# Patient Record
Sex: Female | Born: 1950
Health system: Southern US, Community
[De-identification: ages and names within clinical notes are randomized; demographics above are authoritative.]

## PROBLEM LIST (undated history)

## (undated) DIAGNOSIS — Z720 Tobacco use: Secondary | ICD-10-CM

## (undated) DIAGNOSIS — E0500 Thyrotoxicosis with diffuse goiter without thyrotoxic crisis or storm: Secondary | ICD-10-CM

## (undated) DIAGNOSIS — E039 Hypothyroidism, unspecified: Secondary | ICD-10-CM

## (undated) DIAGNOSIS — I10 Essential (primary) hypertension: Secondary | ICD-10-CM

## (undated) HISTORY — DX: Essential (primary) hypertension: I10

## (undated) HISTORY — PX: APPENDECTOMY: SHX54

## (undated) HISTORY — DX: Tobacco use: Z72.0

## (undated) HISTORY — DX: Hypothyroidism, unspecified: E03.9

## (undated) HISTORY — DX: Thyrotoxicosis with diffuse goiter without thyrotoxic crisis or storm: E05.00

## (undated) HISTORY — PX: OTHER SURGICAL HISTORY: SHX169

---

## 1998-02-11 ENCOUNTER — Encounter: Admission: RE | Admit: 1998-02-11 | Discharge: 1998-02-11 | Payer: Self-pay | Admitting: Family Medicine

## 1999-12-02 ENCOUNTER — Encounter: Admission: RE | Admit: 1999-12-02 | Discharge: 1999-12-02 | Payer: Self-pay | Admitting: *Deleted

## 1999-12-02 ENCOUNTER — Encounter: Payer: Self-pay | Admitting: *Deleted

## 2000-01-26 ENCOUNTER — Encounter: Payer: Self-pay | Admitting: Endocrinology

## 2000-01-26 ENCOUNTER — Ambulatory Visit (HOSPITAL_COMMUNITY): Admission: RE | Admit: 2000-01-26 | Discharge: 2000-01-26 | Payer: Self-pay | Admitting: Endocrinology

## 2000-02-02 ENCOUNTER — Encounter: Payer: Self-pay | Admitting: Endocrinology

## 2000-02-02 ENCOUNTER — Ambulatory Visit (HOSPITAL_COMMUNITY): Admission: RE | Admit: 2000-02-02 | Discharge: 2000-02-02 | Payer: Self-pay | Admitting: Endocrinology

## 2007-09-30 ENCOUNTER — Ambulatory Visit: Payer: Self-pay | Admitting: Internal Medicine

## 2007-09-30 DIAGNOSIS — I10 Essential (primary) hypertension: Secondary | ICD-10-CM

## 2007-09-30 DIAGNOSIS — J069 Acute upper respiratory infection, unspecified: Secondary | ICD-10-CM | POA: Insufficient documentation

## 2007-09-30 DIAGNOSIS — E890 Postprocedural hypothyroidism: Secondary | ICD-10-CM | POA: Insufficient documentation

## 2007-09-30 DIAGNOSIS — E039 Hypothyroidism, unspecified: Secondary | ICD-10-CM

## 2007-09-30 HISTORY — DX: Essential (primary) hypertension: I10

## 2007-09-30 HISTORY — DX: Hypothyroidism, unspecified: E03.9

## 2007-12-26 ENCOUNTER — Encounter: Payer: Self-pay | Admitting: Internal Medicine

## 2010-02-25 ENCOUNTER — Encounter (HOSPITAL_COMMUNITY): Payer: BC Managed Care – PPO

## 2010-02-25 ENCOUNTER — Other Ambulatory Visit: Payer: Self-pay | Admitting: Ophthalmology

## 2010-02-25 DIAGNOSIS — Z01812 Encounter for preprocedural laboratory examination: Secondary | ICD-10-CM | POA: Insufficient documentation

## 2010-02-25 LAB — DIFFERENTIAL
Basophils Relative: 1 % (ref 0–1)
Eosinophils Absolute: 0.3 10*3/uL (ref 0.0–0.7)
Eosinophils Relative: 4 % (ref 0–5)
Neutrophils Relative %: 72 % (ref 43–77)

## 2010-02-25 LAB — CBC
Platelets: 175 10*3/uL (ref 150–400)
RBC: 4.93 MIL/uL (ref 3.87–5.11)
RDW: 13.9 % (ref 11.5–15.5)
WBC: 8.4 10*3/uL (ref 4.0–10.5)

## 2010-02-26 ENCOUNTER — Other Ambulatory Visit: Payer: Self-pay | Admitting: Ophthalmology

## 2010-02-26 LAB — BASIC METABOLIC PANEL
Chloride: 106 mEq/L (ref 96–112)
Creatinine, Ser: 1.01 mg/dL (ref 0.4–1.2)
GFR calc Af Amer: >60 mL/min (ref >60)
GFR calc non Af Amer: 56 mL/min — ABNORMAL LOW (ref >60)
Potassium: 4 mEq/L (ref 3.5–5.1)

## 2010-03-03 ENCOUNTER — Ambulatory Visit (HOSPITAL_COMMUNITY)
Admission: RE | Admit: 2010-03-03 | Discharge: 2010-03-03 | Disposition: A | Discharge status: Home / Self Care | Payer: BC Managed Care – PPO | Source: Ambulatory Visit | Attending: Ophthalmology | Admitting: Ophthalmology

## 2010-03-03 DIAGNOSIS — H2510 Age-related nuclear cataract, unspecified eye: Secondary | ICD-10-CM | POA: Insufficient documentation

## 2010-03-03 DIAGNOSIS — Z79899 Other long term (current) drug therapy: Secondary | ICD-10-CM | POA: Insufficient documentation

## 2010-03-03 DIAGNOSIS — Z7982 Long term (current) use of aspirin: Secondary | ICD-10-CM | POA: Insufficient documentation

## 2010-03-03 DIAGNOSIS — I10 Essential (primary) hypertension: Secondary | ICD-10-CM | POA: Insufficient documentation

## 2010-03-03 DIAGNOSIS — Z01812 Encounter for preprocedural laboratory examination: Secondary | ICD-10-CM | POA: Insufficient documentation

## 2010-04-10 ENCOUNTER — Other Ambulatory Visit (HOSPITAL_COMMUNITY): Payer: BC Managed Care – PPO

## 2010-04-14 ENCOUNTER — Ambulatory Visit (HOSPITAL_COMMUNITY)
Admission: RE | Admit: 2010-04-14 | Discharge: 2010-04-14 | Disposition: A | Discharge status: Home / Self Care | Payer: BC Managed Care – PPO | Source: Ambulatory Visit | Attending: Ophthalmology | Admitting: Ophthalmology

## 2010-04-14 DIAGNOSIS — I10 Essential (primary) hypertension: Secondary | ICD-10-CM | POA: Insufficient documentation

## 2010-04-14 DIAGNOSIS — Z79899 Other long term (current) drug therapy: Secondary | ICD-10-CM | POA: Insufficient documentation

## 2010-04-14 DIAGNOSIS — H2510 Age-related nuclear cataract, unspecified eye: Secondary | ICD-10-CM | POA: Insufficient documentation

## 2010-07-28 NOTE — Op Note (Signed)
  [name], [name]             ACCOUNT NO.:  [id number]  MEDICAL RECORD NO.:  [id number]  LOCATION:  [facility]YP                          FACILITY:  [facility]  PHYSICIAN:  Susanne Greenhouse, MD       DATE OF BIRTH:  04/28/1950  DATE OF PROCEDURE: DATE OF DISCHARGE:  04/14/2010                              OPERATIVE REPORT   PREOPERATIVE DIAGNOSIS:  Nuclear cataract, right eye.  POSTOPERATIVE DIAGNOSIS:  Nuclear cataract, right eye.  DIAGNOSIS CODE:  366.16.  PROSTHETIC DEVICE USED:  Lenstec posterior chamber lens, model Softec HD.  Power of 11.5, serial number is [id number].          ______________________________ Susanne Greenhouse, MD     KEH/MEDQ  D:  06/16/2010  T:  06/17/2010  Job:  [id number]  Electronically Signed by Gemma Payor MD on 07/28/2010 10:24:57 AM

## 2011-03-10 ENCOUNTER — Encounter: Payer: Self-pay | Admitting: Internal Medicine

## 2011-03-12 ENCOUNTER — Ambulatory Visit: Payer: BC Managed Care – PPO | Admitting: Internal Medicine

## 2012-08-12 ENCOUNTER — Other Ambulatory Visit: Payer: Self-pay | Admitting: *Deleted

## 2012-08-12 MED ORDER — LEVOTHYROXINE SODIUM 112 MCG PO TABS: 112.0000 ug | ORAL_TABLET | Freq: Every day | ORAL | Status: DC

## 2012-08-12 MED ORDER — BISOPROLOL-HYDROCHLOROTHIAZIDE 5-6.25 MG PO TABS: 1.0000 | ORAL_TABLET | Freq: Every day | ORAL | Status: DC

## 2012-09-26 ENCOUNTER — Ambulatory Visit: Payer: BC Managed Care – PPO | Admitting: Endocrinology

## 2012-09-28 ENCOUNTER — Encounter: Payer: Self-pay | Admitting: Endocrinology

## 2012-09-28 ENCOUNTER — Ambulatory Visit (INDEPENDENT_AMBULATORY_CARE_PROVIDER_SITE_OTHER): Payer: BC Managed Care – PPO | Admitting: Endocrinology

## 2012-09-28 ENCOUNTER — Other Ambulatory Visit: Payer: Self-pay | Admitting: *Deleted

## 2012-09-28 VITALS — BP 122/78 | HR 62 | Temp 98.3°F | Resp 12 | Ht 62.0 in | Wt 207.6 lb

## 2012-09-28 DIAGNOSIS — I10 Essential (primary) hypertension: Secondary | ICD-10-CM

## 2012-09-28 DIAGNOSIS — Z23 Encounter for immunization: Secondary | ICD-10-CM

## 2012-09-28 DIAGNOSIS — E890 Postprocedural hypothyroidism: Secondary | ICD-10-CM

## 2012-09-28 LAB — URINALYSIS, ROUTINE W REFLEX MICROSCOPIC
Bilirubin Urine: NEGATIVE
Leukocytes, UA: NEGATIVE
Nitrite: NEGATIVE
Specific Gravity, Urine: 1.02 (ref 1.000–1.030)
Total Protein, Urine: NEGATIVE
Urine Glucose: NEGATIVE
pH: 6 (ref 5.0–8.0)

## 2012-09-28 LAB — COMPREHENSIVE METABOLIC PANEL
Albumin: 3.8 g/dL (ref 3.5–5.2)
CO2: 31 mEq/L (ref 19–32)
Calcium: 9.7 mg/dL (ref 8.4–10.5)
Chloride: 104 mEq/L (ref 96–112)
GFR: 58.58 mL/min — ABNORMAL LOW (ref >60.00)
Glucose, Bld: 75 mg/dL (ref 70–99)
Potassium: 3.6 mEq/L (ref 3.5–5.1)
Sodium: 141 mEq/L (ref 135–145)
Total Protein: 7.6 g/dL (ref 6.0–8.3)

## 2012-09-28 LAB — LIPID PANEL: Total CHOL/HDL Ratio: 5

## 2012-09-28 LAB — TSH: TSH: 4.38 u[IU]/mL (ref 0.35–5.50)

## 2012-09-28 NOTE — Progress Notes (Signed)
Quick Note:  Please let patient know that the thyroid result is low normal; to take EXTRA HALF TAB once week of Synthroid. Cholesterol borderline high, reduce animal and daily fats ______

## 2012-09-28 NOTE — Progress Notes (Signed)
Patient ID: Olivia Blanchard, female   DOB: [birth year], 62 y.o.   MRN: [id number]  Reason for Appointment:  Hypothyroidism, followup visit    History of Present Illness:    The hypothyroidism was first diagnosed in 2002 after treatment of Graves' disease with I-131  The symptoms consistent with hypothyroidism are: none, has no fatigue or cord intolerance. Does have some weight gain in the last year  The treatments that the patient has taken include generic Synthroid.           The response to therapy has been  well-controlled symptoms. However she had gained weight progressively after her I-131 ablation         Compliance with the medical regimen has been as prescribed with taking the tablet in the morning before breakfast.  HYPERTENSION: She has been on treatment since about 2005. More recently has been taking Ziac with good control. No lightheadedness or does not monitor blood pressure at home and has not seen a PCP regularly    Medication List       This list is accurate as of: 09/28/12 10:17 AM.  Always use your most recent med list.               bisoprolol-hydrochlorothiazide 5-6.25 MG per tablet  Commonly known as:  ZIAC  Take 1 tablet by mouth daily. Wants 90 day supply     levothyroxine 112 MCG tablet  Commonly known as:  SYNTHROID, LEVOTHROID  Take 112 mcg by mouth daily. Wants 90 day supply     PRESERVISION AREDS 2 PO  Take by mouth.        Past Medical History  Diagnosis Date  . HYPERTENSION 09/30/2007  . HYPOTHYROIDISM 09/30/2007  . Grave's disease   . Tobacco abuse     Past Surgical History  Procedure Laterality Date  . Appendectomy      Family History  Problem Relation Age of Onset  . Diabetes Mother   . Thyroid disease Mother   . Cancer Neg Hx     sister s- lung Ca, copd    Social History:  reports that she has been smoking.  She has never used smokeless tobacco. Her alcohol and drug histories are not on file.  Allergies:  Allergies   Allergen Reactions  . Codeine Phosphate    ROS  Insomnia is her only complaint Has had mild hypercholesterolemia previously No history of diabetes   Examination:   BP 110/68  Pulse 62  Temp(Src) 98.3 F (36.8 C)  Resp 12  Ht 5\' 2"  (1.575 m)  Wt 207 lb 9.6 oz (94.167 kg)  BMI 37.96 kg/m2  SpO2 98%   GENERAL APPEARANCE: Alert And looks well.    NECK: no mass in the thyroid bed           NEUROLOGIC EXAM: DTRs 2+ bilaterally at biceps. No tremor    Assessments/PLAN   Hypothyroidism, post ablative and currently asymptomatic on 112 mcg Synthroid supplementation, she prefers generic Needs to have thyroid levels checked today to adjust medication if needed especially since she has gained weight  Hypertension: This has been mild and blood pressure is well-controlled with diet, asymptomatic  Previous history of hyperlipidemia: Will check lipids again   To review previous records when available   [facility] 09/28/2012, 10:17 AM   Addendum: TSH 4.3, will have her take extra half tablet on Sundays LDL about 130, continue diet alone

## 2012-09-29 ENCOUNTER — Telehealth: Payer: Self-pay | Admitting: *Deleted

## 2012-09-29 NOTE — Telephone Encounter (Signed)
N/a on home phone 

## 2012-09-29 NOTE — Telephone Encounter (Signed)
Message copied by Hermenia Bers on Thu Sep 29, 2012 11:49 AM ------      Message from: Reather Littler      Created: Wed Sep 28, 2012  7:17 PM       Please let patient know that the thyroid result is low normal; to take EXTRA HALF TAB once week of Synthroid. Cholesterol borderline high, reduce animal and daily fats ------

## 2012-09-30 ENCOUNTER — Telehealth: Payer: Self-pay | Admitting: Endocrinology

## 2012-09-30 MED ORDER — BISOPROLOL-HYDROCHLOROTHIAZIDE 5-6.25 MG PO TABS: 1.0000 | ORAL_TABLET | Freq: Every day | ORAL | Status: DC

## 2012-09-30 MED ORDER — LEVOTHYROXINE SODIUM 112 MCG PO TABS: 112.0000 ug | ORAL_TABLET | Freq: Every day | ORAL | Status: DC

## 2012-09-30 NOTE — Telephone Encounter (Signed)
Results given, rx sent

## 2012-12-20 ENCOUNTER — Telehealth: Payer: Self-pay | Admitting: *Deleted

## 2012-12-20 NOTE — Telephone Encounter (Signed)
I am not her PCP, she is to call whoever her PCP is

## 2012-12-20 NOTE — Telephone Encounter (Signed)
Noted, pt is aware 

## 2012-12-20 NOTE — Telephone Encounter (Signed)
Pt is wanting an rx for a cough, she said it started yesterday and she hardly slept, she's not coughing anything up nor is she running a fever.

## 2012-12-28 ENCOUNTER — Encounter: Payer: Self-pay | Admitting: *Deleted

## 2012-12-28 ENCOUNTER — Ambulatory Visit: Payer: BC Managed Care – PPO | Admitting: Endocrinology

## 2013-03-21 ENCOUNTER — Other Ambulatory Visit: Payer: Self-pay | Admitting: Endocrinology

## 2013-03-24 ENCOUNTER — Other Ambulatory Visit: Payer: Self-pay | Admitting: Endocrinology

## 2013-06-17 ENCOUNTER — Other Ambulatory Visit: Payer: Self-pay | Admitting: Endocrinology

## 2013-09-25 ENCOUNTER — Other Ambulatory Visit (INDEPENDENT_AMBULATORY_CARE_PROVIDER_SITE_OTHER): Payer: BC Managed Care – PPO

## 2013-09-25 ENCOUNTER — Other Ambulatory Visit: Payer: Self-pay | Admitting: *Deleted

## 2013-09-25 DIAGNOSIS — E890 Postprocedural hypothyroidism: Secondary | ICD-10-CM

## 2013-09-26 LAB — T4, FREE: Free T4: 1.03 ng/dL (ref 0.60–1.60)

## 2013-09-26 LAB — TSH: TSH: 2.87 u[IU]/mL (ref 0.35–4.50)

## 2013-09-28 ENCOUNTER — Ambulatory Visit (INDEPENDENT_AMBULATORY_CARE_PROVIDER_SITE_OTHER): Payer: BC Managed Care – PPO | Admitting: Endocrinology

## 2013-09-28 ENCOUNTER — Encounter: Payer: Self-pay | Admitting: Endocrinology

## 2013-09-28 VITALS — BP 125/78 | HR 62 | Temp 97.8°F | Wt 203.0 lb

## 2013-09-28 DIAGNOSIS — Z23 Encounter for immunization: Secondary | ICD-10-CM

## 2013-09-28 DIAGNOSIS — I10 Essential (primary) hypertension: Secondary | ICD-10-CM

## 2013-09-28 DIAGNOSIS — E890 Postprocedural hypothyroidism: Secondary | ICD-10-CM

## 2013-09-28 MED ORDER — BISOPROLOL-HYDROCHLOROTHIAZIDE 5-6.25 MG PO TABS: ORAL_TABLET | ORAL | Status: DC

## 2013-09-28 MED ORDER — LEVOTHYROXINE SODIUM 112 MCG PO TABS: ORAL_TABLET | ORAL | Status: DC

## 2013-09-28 NOTE — Progress Notes (Signed)
Patient ID: Olivia Blanchard, female   DOB: [birth year], 63 y.o.   MRN: [id number]  Reason for Appointment:  Hypothyroidism, followup visit    History of Present Illness:    The hypothyroidism was first diagnosed in 2002 after treatment of Graves' disease with I-131  The symptoms consistent with hypothyroidism are: none, has no fatigue or cord intolerance. Does have some weight gain in the last year  The treatments that the patient has taken include generic Synthroid.           The response to therapy has been  well-controlled symptoms. However she had gained weight after her I-131 ablation         Compliance with the medical regimen has been as prescribed with taking the tablet daily in the morning before breakfast. Her dose has not been changed significantly. She was told to take an extra half tablet on Sundays on her last visit but she forgot to do this; however her TSH is still improved  She does complain of feeling fatigued but this is not new and related to her difficulty sleeping which is chronic  Lab Results  Component Value Date   FREET4 1.03 09/25/2013   FREET4 0.91 09/28/2012   TSH 2.87 09/25/2013   TSH 4.38 09/28/2012   Wt Readings from Last 3 Encounters:  09/28/13 203 lb (92.08 kg)  09/28/12 207 lb 9.6 oz (94.167 kg)  09/30/07 213 lb (96.616 kg)    HYPERTENSION: She has been on treatment since about 2005. More recently has been taking Ziac with good control. No lightheadedness or does not monitor blood pressure at home and has not seen a PCP regularly    Medication List       This list is accurate as of: 09/28/13  9:59 AM.  Always use your most recent med list.               bisoprolol-hydrochlorothiazide 5-6.25 MG per tablet  Commonly known as:  ZIAC  TAKE 1 TABLET BY MOUTH EVERY DAY     levothyroxine 112 MCG tablet  Commonly known as:  SYNTHROID, LEVOTHROID  TAKE 1 TABLET BY MOUTH EVERY DAY **SCHEDULE FOLLOW UP APPT WITH DR Olivia Blanchard FOR FURTHER REFILLS**     PRESERVISION AREDS 2 PO  Take by mouth.        Past Medical History  Diagnosis Date  . HYPERTENSION 09/30/2007  . HYPOTHYROIDISM 09/30/2007  . Grave's disease   . Tobacco abuse     Past Surgical History  Procedure Laterality Date  . Appendectomy      Family History  Problem Relation Age of Onset  . Diabetes Mother   . Thyroid disease Mother   . Cancer Neg Hx     sister s- lung Ca, copd    Social History:  reports that she has been smoking.  She has never used smokeless tobacco. Her alcohol and drug histories are not on file.  Allergies:  Allergies  Allergen Reactions  . Codeine Phosphate    ROS  Continues to have chronic insomnia Not depressed Has had mild hypercholesterolemia previously, last LDL 130  No history of diabetes   Examination:   BP 125/78  Pulse 62  Temp(Src) 97.8 F (36.6 C)  Wt 203 lb (92.08 kg)  SpO2 99%   GENERAL APPEARANCE:  she looks well, no puffiness of the hands or eyes  NECK:  thyroid nonpalpable        NEUROLOGIC EXAM: DTRs 2+ bilaterally at biceps No peripheral edema  Assessments/PLAN   Hypothyroidism, post ablative and currently doing well on 112 mcg Synthroid supplementation, she prefers generic Her thyroid levels are excellent and she will continue the same dose She will followup in one year  Hypertension: This has been mild and blood pressure is well-controlled with Ziac 5 mg  Encouraged her to establish with a PCP and she will try to do this locally  [facility] 09/28/2013, 9:59 AM

## 2013-09-28 NOTE — Progress Notes (Signed)
Pre visit review using our clinic review tool, if applicable. No additional management support is needed unless otherwise documented below in the visit note. 

## 2013-09-28 NOTE — Patient Instructions (Signed)
No change 

## 2014-07-20 ENCOUNTER — Other Ambulatory Visit: Payer: Self-pay | Admitting: *Deleted

## 2014-07-20 MED ORDER — LEVOTHYROXINE SODIUM 112 MCG PO TABS: ORAL_TABLET | ORAL | Status: DC

## 2014-09-11 ENCOUNTER — Other Ambulatory Visit: Payer: Self-pay | Admitting: *Deleted

## 2014-09-11 MED ORDER — BISOPROLOL-HYDROCHLOROTHIAZIDE 5-6.25 MG PO TABS: ORAL_TABLET | ORAL | Status: DC

## 2014-10-16 ENCOUNTER — Other Ambulatory Visit: Payer: Self-pay | Admitting: *Deleted

## 2014-10-16 ENCOUNTER — Other Ambulatory Visit (INDEPENDENT_AMBULATORY_CARE_PROVIDER_SITE_OTHER): Payer: Self-pay

## 2014-10-16 DIAGNOSIS — E890 Postprocedural hypothyroidism: Secondary | ICD-10-CM

## 2014-10-16 LAB — BASIC METABOLIC PANEL
BUN: 14 mg/dL (ref 6–23)
CO2: 24 mEq/L (ref 19–32)
Calcium: 9.5 mg/dL (ref 8.4–10.5)
Chloride: 104 mEq/L (ref 96–112)
Creatinine, Ser: 1.04 mg/dL (ref 0.40–1.20)
GFR: 56.89 mL/min — AB (ref >60.00)
Glucose, Bld: 87 mg/dL (ref 70–99)
POTASSIUM: 4.3 meq/L (ref 3.5–5.1)
SODIUM: 141 meq/L (ref 135–145)

## 2014-10-16 LAB — TSH: TSH: 13.69 u[IU]/mL — AB (ref 0.35–4.50)

## 2014-10-16 LAB — T4, FREE: Free T4: 0.76 ng/dL (ref 0.60–1.60)

## 2014-10-25 ENCOUNTER — Other Ambulatory Visit: Payer: Self-pay | Admitting: *Deleted

## 2014-10-25 ENCOUNTER — Encounter: Payer: Self-pay | Admitting: Endocrinology

## 2014-10-25 ENCOUNTER — Ambulatory Visit (INDEPENDENT_AMBULATORY_CARE_PROVIDER_SITE_OTHER): Payer: Self-pay | Admitting: Endocrinology

## 2014-10-25 ENCOUNTER — Telehealth: Payer: Self-pay | Admitting: Endocrinology

## 2014-10-25 VITALS — BP 138/82 | HR 64 | Temp 98.5°F | Resp 16 | Ht 62.0 in | Wt 200.4 lb

## 2014-10-25 DIAGNOSIS — E890 Postprocedural hypothyroidism: Secondary | ICD-10-CM

## 2014-10-25 DIAGNOSIS — I10 Essential (primary) hypertension: Secondary | ICD-10-CM

## 2014-10-25 MED ORDER — BISOPROLOL-HYDROCHLOROTHIAZIDE 5-6.25 MG PO TABS: ORAL_TABLET | ORAL | Status: DC

## 2014-10-25 MED ORDER — LEVOTHYROXINE SODIUM 137 MCG PO TABS: 137.0000 ug | ORAL_TABLET | Freq: Every day | ORAL | Status: DC

## 2014-10-25 NOTE — Patient Instructions (Signed)
Pink pill 8 per week  New rx 1 daily

## 2014-10-25 NOTE — Progress Notes (Signed)
Patient ID: DAINA CARA, female   DOB: 09-Nov-1950, 64 y.o.   MRN: [id number]   Reason for Appointment:  Hypothyroidism, followup visit    History of Present Illness:    The hypothyroidism was first diagnosed in 2002 after treatment of Graves' disease with I-131  The symptoms consistent with hypothyroidism are: none, has no fatigue or cold intolerance. Does have some weight gain in the last year  The treatments that the patient has taken include generic Synthroid.           The response to therapy has been  well-controlled symptoms. However she had gained weight after her I-131 ablation       Recent history:       She tends to get tired but this is not new and is not any worse No cold intolerance. Compliance with the medical regimen has been as prescribed with taking the tablet daily in the morning but sometimes takes it after eating , usually not having any day product in the morning   Her dose has not been changed significantly.   However her TSH is higher than usual and not clear why, has not gained any weight.  Wt Readings from Last 3 Encounters:  10/25/14 200 lb 6.4 oz (90.901 kg)  09/28/13 203 lb (92.08 kg)  09/28/12 207 lb 9.6 oz (94.167 kg)   Lab Results  Component Value Date   TSH 13.69* 10/16/2014   TSH 2.87 09/25/2013   TSH 4.38 09/28/2012   FREET4 0.76 10/16/2014   FREET4 1.03 09/25/2013   FREET4 0.91 09/28/2012     HYPERTENSION: She has been on treatment since about 2005.   She has been taking Ziac 5 mg with good control.  No lightheadedness or does not monitor blood pressure at home  Does not have a primary care physician     Medication List       This list is accurate as of: 10/25/14  3:19 PM.  Always use your most recent med list.               bisoprolol-hydrochlorothiazide 5-6.25 MG tablet  Commonly known as:  ZIAC  TAKE 1 TABLET BY MOUTH EVERY DAY     levothyroxine 137 MCG tablet  Commonly known as:  SYNTHROID  Take 1  tablet (137 mcg total) by mouth daily before breakfast.        Past Medical History  Diagnosis Date  . HYPERTENSION 09/30/2007  . HYPOTHYROIDISM 09/30/2007  . Grave's disease   . Tobacco abuse     Past Surgical History  Procedure Laterality Date  . Appendectomy      Family History  Problem Relation Age of Onset  . Diabetes Mother   . Thyroid disease Mother   . Cancer Neg Hx     sister s- lung Ca, copd    Social History:  reports that she has been smoking.  She has never used smokeless tobacco. Her alcohol and drug histories are not on file.  Allergies:  Allergies  Allergen Reactions  . Codeine Phosphate    ROS  Continues to have chronic insomnia  Has had mild hypercholesterolemia previously, last LDL 130  No history of diabetes  And glucose is normal   Examination:   BP 138/82 mmHg  Pulse 64  Temp(Src) 98.5 F (36.9 C)  Resp 16  Ht 5\' 2"  (1.575 m)  Wt 200 lb 6.4 oz (90.901 kg)  BMI 36.64 kg/m2  SpO2 97%   GENERAL APPEARANCE:  she looks well  She has prominence of the left eye with mild lipid retraction and exophthalmos with measurement of 24 mm on the left side, about 22 on the  right  NECK:  thyroid nonpalpable        NEUROLOGIC EXAM:  reflexes brisk bilaterally at biceps No peripheral edema    Assessments/PLAN   Hypothyroidism, post ablative and currently Taking 112 g levothyroxine.  Not clear why her TSH is significantly higher than usual even with good compliance.  she does not appear to be symptomatic    We will increase her dose to 137 g and have her TSH checked in 2 months again  She prefers generic because of the cost She will followup in one year  Hypertension: This has been mild and blood pressure is well-controlled with Ziac 5 mg , to continue   [facility] 10/25/2014, 3:19 PM

## 2014-10-26 NOTE — Telephone Encounter (Signed)
error 

## 2014-12-25 ENCOUNTER — Other Ambulatory Visit (INDEPENDENT_AMBULATORY_CARE_PROVIDER_SITE_OTHER): Payer: Self-pay

## 2014-12-25 DIAGNOSIS — E890 Postprocedural hypothyroidism: Secondary | ICD-10-CM

## 2014-12-25 LAB — TSH: TSH: 0.65 u[IU]/mL (ref 0.35–4.50)

## 2014-12-26 NOTE — Progress Notes (Signed)
Quick Note:  Please let patient know that the lab result is normal and no change needed ______ 

## 2015-01-15 ENCOUNTER — Encounter: Payer: Self-pay | Admitting: *Deleted

## 2015-01-15 ENCOUNTER — Telehealth: Payer: Self-pay | Admitting: Endocrinology

## 2015-01-15 NOTE — Telephone Encounter (Signed)
error 

## 2015-01-16 ENCOUNTER — Other Ambulatory Visit: Payer: Self-pay | Admitting: *Deleted

## 2015-01-16 MED ORDER — LEVOTHYROXINE SODIUM 137 MCG PO TABS: 137.0000 ug | ORAL_TABLET | Freq: Every day | ORAL | Status: DC

## 2015-05-08 ENCOUNTER — Telehealth: Payer: Self-pay | Admitting: Endocrinology

## 2015-05-08 ENCOUNTER — Other Ambulatory Visit: Payer: Self-pay | Admitting: *Deleted

## 2015-05-08 MED ORDER — BISOPROLOL-HYDROCHLOROTHIAZIDE 5-6.25 MG PO TABS: ORAL_TABLET | ORAL | Status: DC

## 2015-05-08 NOTE — Telephone Encounter (Signed)
[facility] needs refill for ziac

## 2015-05-08 NOTE — Telephone Encounter (Signed)
rx sent

## 2015-08-05 ENCOUNTER — Other Ambulatory Visit: Payer: Self-pay

## 2015-08-05 ENCOUNTER — Telehealth: Payer: Self-pay | Admitting: Endocrinology

## 2015-08-05 MED ORDER — LEVOTHYROXINE SODIUM 137 MCG PO TABS
137.0000 ug | ORAL_TABLET | Freq: Every day | ORAL | 5 refills | Status: DC
Start: 2015-08-05 — End: 2015-10-25

## 2015-08-05 NOTE — Telephone Encounter (Signed)
Rx submitted for Levothyroxine to the [facility].

## 2015-08-05 NOTE — Telephone Encounter (Signed)
Refills of medication levothyroxine (SYNTHROID) 137 MCG tablet  [address] [place], [place] - [address] [phone] (Phone) [phone] (Fax)

## 2015-10-21 ENCOUNTER — Other Ambulatory Visit: Payer: Self-pay | Admitting: Endocrinology

## 2015-10-21 DIAGNOSIS — I10 Essential (primary) hypertension: Secondary | ICD-10-CM

## 2015-10-21 DIAGNOSIS — E890 Postprocedural hypothyroidism: Secondary | ICD-10-CM

## 2015-10-22 ENCOUNTER — Other Ambulatory Visit (INDEPENDENT_AMBULATORY_CARE_PROVIDER_SITE_OTHER): Payer: Self-pay

## 2015-10-22 DIAGNOSIS — E890 Postprocedural hypothyroidism: Secondary | ICD-10-CM

## 2015-10-22 DIAGNOSIS — I10 Essential (primary) hypertension: Secondary | ICD-10-CM

## 2015-10-22 LAB — COMPREHENSIVE METABOLIC PANEL
ALK PHOS: 80 U/L (ref 39–117)
ALT: 15 U/L (ref 0–35)
AST: 19 U/L (ref 0–37)
Albumin: 4 g/dL (ref 3.5–5.2)
BILIRUBIN TOTAL: 0.3 mg/dL (ref 0.2–1.2)
BUN: 14 mg/dL (ref 6–23)
CALCIUM: 9.5 mg/dL (ref 8.4–10.5)
CO2: 30 mEq/L (ref 19–32)
Chloride: 103 mEq/L (ref 96–112)
Creatinine, Ser: 1.03 mg/dL (ref 0.40–1.20)
GFR: 57.35 mL/min — AB (ref >60.00)
GLUCOSE: 85 mg/dL (ref 70–99)
POTASSIUM: 3.7 meq/L (ref 3.5–5.1)
Sodium: 140 mEq/L (ref 135–145)
TOTAL PROTEIN: 7.4 g/dL (ref 6.0–8.3)

## 2015-10-22 LAB — LIPID PANEL
Cholesterol: 164 mg/dL (ref 0–200)
HDL: 41.3 mg/dL (ref >39.00)
LDL Cholesterol: 104 mg/dL — ABNORMAL HIGH (ref 0–99)
NONHDL: 122.38
TRIGLYCERIDES: 91 mg/dL (ref 0.0–149.0)
Total CHOL/HDL Ratio: 4
VLDL: 18.2 mg/dL (ref 0.0–40.0)

## 2015-10-22 LAB — TSH: TSH: 0.58 u[IU]/mL (ref 0.35–4.50)

## 2015-10-22 LAB — T4, FREE: FREE T4: 1.1 ng/dL (ref 0.60–1.60)

## 2015-10-25 ENCOUNTER — Other Ambulatory Visit: Payer: Self-pay | Admitting: *Deleted

## 2015-10-25 ENCOUNTER — Ambulatory Visit (INDEPENDENT_AMBULATORY_CARE_PROVIDER_SITE_OTHER): Payer: Self-pay | Admitting: Endocrinology

## 2015-10-25 ENCOUNTER — Encounter: Payer: Self-pay | Admitting: Endocrinology

## 2015-10-25 VITALS — BP 118/68 | HR 68 | Temp 98.0°F | Resp 16 | Ht 62.0 in | Wt 195.6 lb

## 2015-10-25 DIAGNOSIS — I10 Essential (primary) hypertension: Secondary | ICD-10-CM

## 2015-10-25 DIAGNOSIS — E890 Postprocedural hypothyroidism: Secondary | ICD-10-CM

## 2015-10-25 DIAGNOSIS — E7800 Pure hypercholesterolemia, unspecified: Secondary | ICD-10-CM

## 2015-10-25 MED ORDER — LEVOTHYROXINE SODIUM 137 MCG PO TABS: 137.0000 ug | ORAL_TABLET | Freq: Every day | ORAL | 11 refills | Status: DC

## 2015-10-25 MED ORDER — BISOPROLOL-HYDROCHLOROTHIAZIDE 5-6.25 MG PO TABS: ORAL_TABLET | ORAL | 11 refills | Status: DC

## 2015-10-25 NOTE — Patient Instructions (Signed)
Skip Sundays for Thyroid

## 2015-10-25 NOTE — Progress Notes (Signed)
Patient ID: Olivia Blanchard, female   DOB: January 02, 1951, 65 y.o.   MRN: [id number]   Reason for Appointment:  Hypothyroidism, followup visit    History of Present Illness:    The hypothyroidism was first diagnosed in 2002 after treatment of Graves' disease with I-131  The symptoms consistent with hypothyroidism are: none, has no fatigue or cold intolerance. Does have some weight gain in the last year  The treatments that the patient has taken include generic Synthroid.           The response to therapy has been  well-controlled symptoms. However she had gained weight after her I-131 ablation      Recent history:       On her last visit in 10/2014 her TSH was 13.7.  She was having her usual mild fatigue but she thinks that after increasing her dose from 112 up to 137 she had less fatigue She feels fairly good now and also no shakiness or palpitations She has been trying to lose weight and is doing better with this  Compliance with the medical regimen has been as prescribed with taking the tablet daily in the morning   Wt Readings from Last 3 Encounters:  10/25/15 195 lb 9.6 oz (88.7 kg)  10/25/14 200 lb 6.4 oz (90.9 kg)  09/28/13 203 lb (92.1 kg)   Her follow-up TSH in 12/16 was only 0.65 and she is taking 6-1/2 tablets a week of the 137 g dose   Lab Results  Component Value Date   TSH 0.58 10/22/2015   TSH 0.65 12/25/2014   TSH 13.69 (H) 10/16/2014   FREET4 1.10 10/22/2015   FREET4 0.76 10/16/2014   FREET4 1.03 09/25/2013    Graves eye disease: She does not complain of dryness of her eyes   HYPERTENSION: She has been on treatment since about 2005.   She has been taking Ziac 5 mg with good control.  Does not monitor at home     Medication List       Accurate as of 10/25/15 10:02 AM. Always use your most recent med list.          bisoprolol-hydrochlorothiazide 5-6.25 MG tablet Commonly known as:  ZIAC TAKE 1 TABLET BY MOUTH EVERY DAY     levothyroxine 137 MCG tablet Commonly known as:  SYNTHROID Take 1 tablet (137 mcg total) by mouth daily before breakfast.       Past Medical History:  Diagnosis Date  . Grave's disease   . HYPERTENSION 09/30/2007  . HYPOTHYROIDISM 09/30/2007  . Tobacco abuse     Past Surgical History:  Procedure Laterality Date  . APPENDECTOMY      Family History  Problem Relation Age of Onset  . Diabetes Mother   . Thyroid disease Mother   . Cancer Neg Hx     sister s- lung Ca, copd    Social History:  reports that she has been smoking.  She has never used smokeless tobacco. Her alcohol and drug histories are not on file.  Allergies:  Allergies  Allergen Reactions  . Codeine Phosphate    ROS  Has had mild hypercholesterolemia previously With LDL 130 Has improved levels now  Lab Results  Component Value Date   CHOL 164 10/22/2015   HDL 41.30 10/22/2015   LDLCALC 104 (H) 10/22/2015   TRIG 91.0 10/22/2015   CHOLHDL 4 10/22/2015      Examination:   BP 118/68   Pulse 68   Temp  83 F (36.7 C)   Resp 16   Ht 5\' 2"  (1.575 m)   Wt 195 lb 9.6 oz (88.7 kg)   SpO2 98%   BMI 35.78 kg/m   she looks well  She has prominence of the left eye with late  retraction and exophthalmos with measurement of 22 mm on the left side, about 20 on the right        Deep tendon reflexes appear normal bilaterally at biceps No peripheral edema    Assessments/PLAN   Hypothyroidism, post ablative and needing a relatively higher dose since 2016 Subjectively doing well Her TSH is again low normal even with taking 6-1/2 tablets per week  She will reduce the dose to 6 tablets a week She will followup in one year  Hypertension: This has been mild and blood pressure is well-controlled with Ziac 5 mg , to continue same dose  LIPIDS: Well controlled without medications, may be benefiting from weight loss   Olivia Blanchard 10/25/2015, 10:02 AM

## 2015-10-27 NOTE — Progress Notes (Signed)
Please let patient know that the cholesterol is better at 164 previously 193 We will recheck her in December

## 2016-09-24 ENCOUNTER — Encounter: Payer: Self-pay | Admitting: Internal Medicine

## 2016-10-27 ENCOUNTER — Telehealth: Payer: Self-pay | Admitting: Endocrinology

## 2016-10-27 NOTE — Telephone Encounter (Signed)
I have refilled for 30 days and no refills. I also left note for patient to make an appointment for future refills.

## 2016-10-27 NOTE — Telephone Encounter (Signed)
Give 30 tablets with reminder to make appointment

## 2016-10-27 NOTE — Telephone Encounter (Signed)
This patient has not been seen since 10/25/2015 and does not have a future appointment. Please advise if okay to refill or to refuse with appointment needed.

## 2016-12-08 ENCOUNTER — Ambulatory Visit: Payer: Self-pay | Admitting: Endocrinology

## 2016-12-08 ENCOUNTER — Encounter: Payer: Self-pay | Admitting: Endocrinology

## 2016-12-08 VITALS — BP 132/68 | HR 73 | Ht 63.0 in | Wt 188.6 lb

## 2016-12-08 DIAGNOSIS — E7800 Pure hypercholesterolemia, unspecified: Secondary | ICD-10-CM | POA: Diagnosis not present

## 2016-12-08 DIAGNOSIS — I10 Essential (primary) hypertension: Secondary | ICD-10-CM | POA: Diagnosis not present

## 2016-12-08 DIAGNOSIS — E890 Postprocedural hypothyroidism: Secondary | ICD-10-CM

## 2016-12-08 LAB — LIPID PANEL
CHOL/HDL RATIO: 5
Cholesterol: 177 mg/dL (ref 0–200)
HDL: 37.3 mg/dL — AB (ref >39.00)
LDL Cholesterol: 111 mg/dL — ABNORMAL HIGH (ref 0–99)
NonHDL: 139.84
TRIGLYCERIDES: 145 mg/dL (ref 0.0–149.0)
VLDL: 29 mg/dL (ref 0.0–40.0)

## 2016-12-08 LAB — COMPREHENSIVE METABOLIC PANEL
ALK PHOS: 86 U/L (ref 39–117)
ALT: 11 U/L (ref 0–35)
AST: 16 U/L (ref 0–37)
Albumin: 4.1 g/dL (ref 3.5–5.2)
BILIRUBIN TOTAL: 0.3 mg/dL (ref 0.2–1.2)
BUN: 17 mg/dL (ref 6–23)
CALCIUM: 9.4 mg/dL (ref 8.4–10.5)
CO2: 31 meq/L (ref 19–32)
Chloride: 102 mEq/L (ref 96–112)
Creatinine, Ser: 1.03 mg/dL (ref 0.40–1.20)
GFR: 57.14 mL/min — AB (ref >60.00)
Glucose, Bld: 106 mg/dL — ABNORMAL HIGH (ref 70–99)
Potassium: 3.4 mEq/L — ABNORMAL LOW (ref 3.5–5.1)
Sodium: 139 mEq/L (ref 135–145)
TOTAL PROTEIN: 7.3 g/dL (ref 6.0–8.3)

## 2016-12-08 LAB — TSH: TSH: 1.61 u[IU]/mL (ref 0.35–4.50)

## 2016-12-08 LAB — T4, FREE: Free T4: 0.87 ng/dL (ref 0.60–1.60)

## 2016-12-08 MED ORDER — BISOPROLOL-HYDROCHLOROTHIAZIDE 5-6.25 MG PO TABS: 1.0000 | ORAL_TABLET | Freq: Every day | ORAL | 2 refills | Status: DC

## 2016-12-08 NOTE — Progress Notes (Signed)
Patient ID: DEZYRAE KENSINGER, female   DOB: 01-27-1950, 66 y.o.   MRN: [id number]   Reason for Appointment:  Hypothyroidism, followup visit    History of Present Illness:    The hypothyroidism was first diagnosed in 2002 after treatment of Graves' disease with I-131  The symptoms consistent with hypothyroidism are: none, has no fatigue or cold intolerance. Does have some weight gain in the last year  The treatments that the patient has taken include generic Synthroid.           The response to therapy has been  well-controlled symptoms. However she had gained weight after her I-131 ablation      Recent history:       On her last visit in 2017 her TSH was improved with the 137 g dose that was started in 2016 Previous TSH was 13.7 However because of her TSH being only 0.58 she was told to take 6 tablets a week She feels fairly good recently, no unusual fatigue No heat intolerance,  shakiness or palpitations  She has been trying to lose weight dietary changes and increased activity and continues to lose weight gradually  Compliance with the medical regimen has been as prescribed with taking the tablet daily in the morning   Wt Readings from Last 3 Encounters:  12/08/16 188 lb 9.6 oz (85.5 kg)  10/25/15 195 lb 9.6 oz (88.7 kg)  10/25/14 200 lb 6.4 oz (90.9 kg)      Lab Results  Component Value Date   TSH 0.58 10/22/2015   TSH 0.65 12/25/2014   TSH 13.69 (H) 10/16/2014   FREET4 1.10 10/22/2015   FREET4 0.76 10/16/2014   FREET4 1.03 09/25/2013    Graves eye disease: She has prominence of the left eye and is using OTC artificial tears   HYPERTENSION: She has been on treatment since about 2005.   She has been taking Ziac 5 mg with consistently good control.  Has been taking this regularly  Does not monitor blood pressure at home   Allergies as of 12/08/2016      Reactions   Codeine Phosphate       Medication List        Accurate as of 12/08/16  1:59 PM.  Always use your most recent med list.          bisoprolol-hydrochlorothiazide 5-6.25 MG tablet Commonly known as:  ZIAC TAKE 1 TABLET BY MOUTH EVERY DAY   levothyroxine 137 MCG tablet Commonly known as:  SYNTHROID, LEVOTHROID TAKE 1 TABLET BY MOUTH EVERY DAY BEFORE BREAKFAST       Past Medical History:  Diagnosis Date  . Grave's disease   . HYPERTENSION 09/30/2007  . HYPOTHYROIDISM 09/30/2007  . Tobacco abuse     Past Surgical History:  Procedure Laterality Date  . APPENDECTOMY      Family History  Problem Relation Age of Onset  . Diabetes Mother   . Thyroid disease Mother   . Cancer Neg Hx        sister s- lung Ca, copd    Social History:  reports that she has been smoking.  she has never used smokeless tobacco. Her alcohol and drug histories are not on file.  Allergies:  Allergies  Allergen Reactions  . Codeine Phosphate    ROS  Has had mild hypercholesterolemia previously With LDL 130 Had  improved levels in 2017  Lab Results  Component Value Date   CHOL 164 10/22/2015   HDL 41.30  10/22/2015   LDLCALC 104 (H) 10/22/2015   TRIG 91.0 10/22/2015   CHOLHDL 4 10/22/2015      Examination:   BP 132/68   Pulse 73   Ht 5\' 3"  (1.6 m)   Wt 188 lb 9.6 oz (85.5 kg)   SpO2 98%   BMI 33.41 kg/m   She looks well   She has proptosis of the left eye with upper lid retraction no erythema of the conjunctiva        Deep tendon reflexes show normal relaxation and left biceps No peripheral edema    Assessments/PLAN   Hypothyroidism, post ablative since 2002 Currently taking 137 g, 6 days a week She has no unusual fatigue, has been losing weight with improved diet Subjectively she looks euthyroid  Continues to have thyroid levels checked again  Hypertension: Well-controlled with bisoprolol HCT 5 mg daily for several years and she will continue the same dose  LIPIDS: Needs follow-up levels, has had previous  hypercholesterolemia    Sheina Mcleish 12/08/2016, 1:59 PM   ADDENDUM: TSH quite good, continue same dosage, needs to increase dietary potassium as a level is 3.4 Cholesterol only mildly increased Follow-up in one year

## 2016-12-09 ENCOUNTER — Telehealth: Payer: Self-pay | Admitting: Endocrinology

## 2016-12-09 ENCOUNTER — Other Ambulatory Visit: Payer: Self-pay

## 2016-12-09 MED ORDER — LEVOTHYROXINE SODIUM 137 MCG PO TABS: ORAL_TABLET | ORAL | 2 refills | Status: DC

## 2016-12-09 NOTE — Telephone Encounter (Signed)
Patient returned Megan's call-best phone # for patient is ph# [phone]

## 2016-12-09 NOTE — Telephone Encounter (Signed)
Called patient and gave her the lab results.

## 2017-02-09 ENCOUNTER — Other Ambulatory Visit: Payer: Self-pay

## 2017-02-09 NOTE — Patient Outreach (Signed)
[facility]) Care Management  02/09/2017  Olivia Blanchard 1950/08/11 [id number]   Telephone call to review health risk assessment and screen for care management needs for  [employer]. Member states she is doing fine now that she has insurance.  Denies any case management needs at this time.  States she smokes and is not interested in quitting at this time. Encouraged to make appt with primary provider for annual physical Plan to mail Quit Smart information  Member assessed with no further interventions needed at this time Successful outreach letter sent.  Peter Garter RN, [facility] Care Management Coordinator [facility] Care Management [phone]

## 2017-08-21 ENCOUNTER — Other Ambulatory Visit: Payer: Self-pay | Admitting: Endocrinology

## 2017-11-13 ENCOUNTER — Other Ambulatory Visit: Payer: Self-pay | Admitting: Endocrinology

## 2017-12-08 ENCOUNTER — Encounter: Payer: Self-pay | Admitting: Endocrinology

## 2017-12-08 ENCOUNTER — Ambulatory Visit (INDEPENDENT_AMBULATORY_CARE_PROVIDER_SITE_OTHER): Payer: PPO | Admitting: Endocrinology

## 2017-12-08 VITALS — BP 120/72 | HR 69 | Ht 63.0 in | Wt 180.4 lb

## 2017-12-08 DIAGNOSIS — E7800 Pure hypercholesterolemia, unspecified: Secondary | ICD-10-CM

## 2017-12-08 DIAGNOSIS — E890 Postprocedural hypothyroidism: Secondary | ICD-10-CM

## 2017-12-08 DIAGNOSIS — I10 Essential (primary) hypertension: Secondary | ICD-10-CM

## 2017-12-08 LAB — COMPREHENSIVE METABOLIC PANEL
ALT: 14 U/L (ref 0–35)
AST: 9 U/L (ref 0–37)
Albumin: 4 g/dL (ref 3.5–5.2)
Alkaline Phosphatase: 96 U/L (ref 39–117)
BUN: 18 mg/dL (ref 6–23)
CHLORIDE: 102 meq/L (ref 96–112)
CO2: 29 meq/L (ref 19–32)
Calcium: 9.8 mg/dL (ref 8.4–10.5)
Creatinine, Ser: 1.13 mg/dL (ref 0.40–1.20)
GFR: 51.19 mL/min — ABNORMAL LOW (ref >60.00)
Glucose, Bld: 85 mg/dL (ref 70–99)
Potassium: 3.8 mEq/L (ref 3.5–5.1)
Sodium: 138 mEq/L (ref 135–145)
Total Bilirubin: 0.3 mg/dL (ref 0.2–1.2)
Total Protein: 7.6 g/dL (ref 6.0–8.3)

## 2017-12-08 LAB — LIPID PANEL
Cholesterol: 170 mg/dL (ref 0–200)
HDL: 39.7 mg/dL (ref >39.00)
LDL CALC: 99 mg/dL (ref 0–99)
NonHDL: 130.75
Total CHOL/HDL Ratio: 4
Triglycerides: 157 mg/dL — ABNORMAL HIGH (ref 0.0–149.0)
VLDL: 31.4 mg/dL (ref 0.0–40.0)

## 2017-12-08 LAB — T4, FREE: Free T4: 1.03 ng/dL (ref 0.60–1.60)

## 2017-12-08 LAB — TSH: TSH: 0.62 u[IU]/mL (ref 0.35–4.50)

## 2017-12-08 NOTE — Patient Instructions (Signed)
For potassium: bananas, citrus fruits and potatoes

## 2017-12-08 NOTE — Progress Notes (Signed)
Patient ID: Olivia Blanchard, female   DOB: 1950-07-10, 67 y.o.   MRN: [id number]   Reason for Appointment: Endocrinology follow-up visit    History of Present Illness:    The hypothyroidism was first diagnosed in 2002 after treatment of Graves' disease with I-131  The symptoms consistent with hypothyroidism are: none, has no fatigue or cold intolerance. Does have some weight gain in the last year  The treatments that the patient has taken include generic Synthroid.           The response to therapy has been  well-controlled symptoms. However she had gained weight after her I-131 ablation      Recent history:       On her last visit in 12/18 her dose was continued unchanged with 137 g, 6 days a week  She is not complaining of feeling unusually tired No heat or cold intolerance or hair loss Again she has been generally eating smaller portions and has lost some more weight over the last year  Compliance with the medical regimen has been as prescribed with taking the tablet daily in the morning before eating  Wt Readings from Last 3 Encounters:  12/08/17 180 lb 6.4 oz (81.8 kg)  12/08/16 188 lb 9.6 oz (85.5 kg)  10/25/15 195 lb 9.6 oz (88.7 kg)      Lab Results  Component Value Date   TSH 1.61 12/08/2016   TSH 0.58 10/22/2015   TSH 0.65 12/25/2014   FREET4 0.87 12/08/2016   FREET4 1.10 10/22/2015   FREET4 0.76 10/16/2014    Graves eye disease: She has prominence of the left eye and is using OTC artificial tears   HYPERTENSION: She has been on treatment since about 2005.   She has been taking Ziac 5 mg with consistently good control.  Has been taking this regularly  Does not monitor blood pressure at home   Allergies as of 12/08/2017      Reactions   Codeine Phosphate       Medication List        Accurate as of 12/08/17  2:02 PM. Always use your most recent med list.          bisoprolol-hydrochlorothiazide 5-6.25 MG tablet Commonly known as:   ZIAC TAKE ONE TABLET BY MOUTH EVERY DAY   levothyroxine 137 MCG tablet Commonly known as:  SYNTHROID, LEVOTHROID TAKE ONE TABLET BY MOUTH EVERY DAY BEFORE BREAKFAST       Past Medical History:  Diagnosis Date  . Grave's disease   . HYPERTENSION 09/30/2007  . HYPOTHYROIDISM 09/30/2007  . Tobacco abuse     Past Surgical History:  Procedure Laterality Date  . APPENDECTOMY      Family History  Problem Relation Age of Onset  . Diabetes Mother   . Thyroid disease Mother   . Cancer Neg Hx        sister s- lung Ca, copd    Social History:  reports that she has been smoking. She has never used smokeless tobacco. Her alcohol and drug histories are not on file.  Allergies:  Allergies  Allergen Reactions  . Codeine Phosphate    ROS  Has had mild hypercholesterolemia previously With LDL 130 Had  improved levels in 2017  Lab Results  Component Value Date   CHOL 177 12/08/2016   HDL 37.30 (L) 12/08/2016   LDLCALC 111 (H) 12/08/2016   TRIG 145.0 12/08/2016   CHOLHDL 5 12/08/2016      Examination:  BP 120/72 (BP Location: Left Arm, Patient Position: Sitting, Cuff Size: Normal)   Pulse 69   Ht 5\' 3"  (1.6 m)   Wt 180 lb 6.4 oz (81.8 kg)   SpO2 98%   BMI 31.96 kg/m   She looks well   She has mild proptosis of the left eye with upper lid retraction Thyroid not palpable Heart sounds are regular, no abnormal sounds        Deep tendon reflexes are normal on the upper extremity No peripheral edema    Assessments/PLAN   Hypothyroidism, post ablative since 2002  For some time has been taking 137 g, 6 days a week She has no unusual fatigue, has been gradually losing weight with improved diet Exam is unremarkable  She will have her thyroid levels checked today and if normal will come back again in 1 year  Hypertension: Well-controlled with bisoprolol HCT 5 mg daily for several years To recheck potassium today since it was low last year  LIPIDS: Needs follow-up  levels as she has not had any follow-up with her PCP  Given her names of local PCPs for her to choose to replace her tired PCP    Elayne Snare 12/08/2017, 2:02 PM   ADDENDUM: TSH is low normal at 0.6.  She will be changed to 112 mcg from her next prescription Lipids and potassium normal  Elayne Snare

## 2017-12-10 ENCOUNTER — Other Ambulatory Visit: Payer: Self-pay

## 2017-12-10 MED ORDER — BISOPROLOL-HYDROCHLOROTHIAZIDE 5-6.25 MG PO TABS: 1.0000 | ORAL_TABLET | Freq: Every day | ORAL | 3 refills | Status: DC

## 2017-12-10 MED ORDER — LEVOTHYROXINE SODIUM 112 MCG PO TABS: ORAL_TABLET | ORAL | 3 refills | Status: DC

## 2018-11-20 ENCOUNTER — Other Ambulatory Visit: Payer: Self-pay | Admitting: Endocrinology

## 2018-12-08 ENCOUNTER — Encounter: Payer: Self-pay | Admitting: Endocrinology

## 2018-12-08 ENCOUNTER — Other Ambulatory Visit: Payer: Self-pay

## 2018-12-08 ENCOUNTER — Ambulatory Visit (INDEPENDENT_AMBULATORY_CARE_PROVIDER_SITE_OTHER): Payer: PPO | Admitting: Endocrinology

## 2018-12-08 VITALS — BP 130/70 | HR 71 | Ht 63.0 in | Wt 176.2 lb

## 2018-12-08 DIAGNOSIS — E7800 Pure hypercholesterolemia, unspecified: Secondary | ICD-10-CM | POA: Diagnosis not present

## 2018-12-08 DIAGNOSIS — I10 Essential (primary) hypertension: Secondary | ICD-10-CM

## 2018-12-08 DIAGNOSIS — E890 Postprocedural hypothyroidism: Secondary | ICD-10-CM

## 2018-12-08 LAB — LIPID PANEL
Cholesterol: 182 mg/dL (ref 0–200)
HDL: 38.5 mg/dL — ABNORMAL LOW (ref >39.00)
LDL Cholesterol: 118 mg/dL — ABNORMAL HIGH (ref 0–99)
NonHDL: 143.15
Total CHOL/HDL Ratio: 5
Triglycerides: 126 mg/dL (ref 0.0–149.0)
VLDL: 25.2 mg/dL (ref 0.0–40.0)

## 2018-12-08 LAB — COMPREHENSIVE METABOLIC PANEL
ALT: 12 U/L (ref 0–35)
AST: 16 U/L (ref 0–37)
Albumin: 4.3 g/dL (ref 3.5–5.2)
Alkaline Phosphatase: 113 U/L (ref 39–117)
BUN: 10 mg/dL (ref 6–23)
CO2: 31 mEq/L (ref 19–32)
Calcium: 9.9 mg/dL (ref 8.4–10.5)
Chloride: 99 mEq/L (ref 96–112)
Creatinine, Ser: 0.99 mg/dL (ref 0.40–1.20)
GFR: 55.93 mL/min — ABNORMAL LOW (ref >60.00)
Glucose, Bld: 85 mg/dL (ref 70–99)
Potassium: 3.3 mEq/L — ABNORMAL LOW (ref 3.5–5.1)
Sodium: 140 mEq/L (ref 135–145)
Total Bilirubin: 0.5 mg/dL (ref 0.2–1.2)
Total Protein: 7.8 g/dL (ref 6.0–8.3)

## 2018-12-08 LAB — T4, FREE: Free T4: 1.05 ng/dL (ref 0.60–1.60)

## 2018-12-08 LAB — TSH: TSH: 2.28 u[IU]/mL (ref 0.35–4.50)

## 2018-12-08 NOTE — Progress Notes (Signed)
Patient ID: Olivia Blanchard, female   DOB: Nov 14, 1950, 68 y.o.   MRN: [id number]   Reason for Appointment: Endocrinology follow-up visit    History of Present Illness:    The hypothyroidism was first diagnosed in 2002 after treatment of Graves' disease with I-131  The symptoms consistent with hypothyroidism are: none, has no fatigue or cold intolerance. Does have some weight gain in the last year  The treatments that the patient has taken include generic Synthroid.           The response to therapy has been  well-controlled symptoms. However she had gained weight after her I-131 ablation      Recent history:       On her last visit in 12/2017 her dose was changed to 112 mcg Previously was treated with 137 g, 6 days a week but her TSH was low normal at 0.6  She feels fairly good and has no fatigue No recent cold intolerance or hair loss Has lost another 4 pounds since last year  Compliance with the levothyroxine has been  consistent with taking the tablet daily in the morning before breakfast  Wt Readings from Last 3 Encounters:  12/08/18 176 lb 3.2 oz (79.9 kg)  12/08/17 180 lb 6.4 oz (81.8 kg)  12/08/16 188 lb 9.6 oz (85.5 kg)     Lab Results  Component Value Date   TSH 0.62 12/08/2017   TSH 1.61 12/08/2016   TSH 0.58 10/22/2015   FREET4 1.03 12/08/2017   FREET4 0.87 12/08/2016   FREET4 1.10 10/22/2015   Graves eye disease: She has prominence of the left eye and is using OTC artificial tears as needed   HYPERTENSION: She has been on treatment since about 2005.   She has been taking Ziac 5 mg with consistently good control.  Has been taking this daily  Does not monitor blood pressure at home or drugstore but has good control on each visit  BP Readings from Last 3 Encounters:  12/08/18 130/70  12/08/17 120/72  12/08/16 132/68      Allergies as of 12/08/2018      Reactions   Codeine Phosphate       Medication List       Accurate as of  December 08, 2018  2:07 PM. If you have any questions, ask your nurse or doctor.        bisoprolol-hydrochlorothiazide 5-6.25 MG tablet Commonly known as: ZIAC Take 1 tablet by mouth daily.   levothyroxine 112 MCG tablet Commonly known as: SYNTHROID TAKE 1 TABLET BY MOUTH ONCE DAILY       Past Medical History:  Diagnosis Date  . Grave's disease   . HYPERTENSION 09/30/2007  . HYPOTHYROIDISM 09/30/2007  . Tobacco abuse     Past Surgical History:  Procedure Laterality Date  . APPENDECTOMY      Family History  Problem Relation Age of Onset  . Diabetes Mother   . Thyroid disease Mother   . Cancer Neg Hx        sister s- lung Ca, copd    Social History:  reports that she has been smoking. She has never used smokeless tobacco. No history on file for alcohol and drug.  Allergies:  Allergies  Allergen Reactions  . Codeine Phosphate    ROS  Has had mild hypercholesterolemia previously, baseline LDL 130 Had  improved levels in 2019  Lab Results  Component Value Date   CHOL 170 12/08/2017   HDL 39.70  12/08/2017   LDLCALC 99 12/08/2017   TRIG 157.0 (H) 12/08/2017   CHOLHDL 4 12/08/2017   She has not seen her new PCP   Examination:   BP 130/70 (BP Location: Left Arm, Patient Position: Sitting, Cuff Size: Normal)   Pulse 71   Ht 5\' 3"  (1.6 m)   Wt 176 lb 3.2 oz (79.9 kg)   SpO2 97%   BMI 31.21 kg/m   She looks well   She has mild proptosis of the left eye with upper lid retraction No tremor present  No peripheral edema    Assessments/PLAN   Hypothyroidism, post ablative since 2002  She has been on a regimen of 112 mcg of levothyroxine daily since 12/19 She has lost about 4 pounds Otherwise she feels fairly good and has no fatigue, palpitations or heat or cold intolerance  Thyroid levels to be drawn today  Hypertension: Well-controlled with bisoprolol HCT 5 mg daily for several years Chemistry will be checked today  LIPIDS: Needs follow-up levels  today  Follow-up annually unless thyroid levels are abnormal She also will try to establish with her new PCP   Elayne Snare 12/08/2018, 2:07 PM    Elayne Snare

## 2018-12-12 ENCOUNTER — Telehealth: Payer: Self-pay

## 2018-12-12 NOTE — Telephone Encounter (Signed)
Please see result note 

## 2018-12-12 NOTE — Telephone Encounter (Signed)
Pt called and requested the results of her labs

## 2018-12-13 ENCOUNTER — Other Ambulatory Visit: Payer: Self-pay

## 2018-12-13 MED ORDER — LEVOTHYROXINE SODIUM 112 MCG PO TABS: 112.0000 ug | ORAL_TABLET | Freq: Every day | ORAL | 3 refills | Status: DC

## 2018-12-13 MED ORDER — POTASSIUM CHLORIDE ER 10 MEQ PO TBCR: EXTENDED_RELEASE_TABLET | ORAL | 0 refills | Status: DC

## 2018-12-13 MED ORDER — BISOPROLOL-HYDROCHLOROTHIAZIDE 5-6.25 MG PO TABS: 1.0000 | ORAL_TABLET | Freq: Every day | ORAL | 3 refills | Status: DC

## 2018-12-19 ENCOUNTER — Telehealth (INDEPENDENT_AMBULATORY_CARE_PROVIDER_SITE_OTHER): Payer: PPO | Admitting: Family Medicine

## 2018-12-19 DIAGNOSIS — F17210 Nicotine dependence, cigarettes, uncomplicated: Secondary | ICD-10-CM

## 2018-12-19 DIAGNOSIS — J40 Bronchitis, not specified as acute or chronic: Secondary | ICD-10-CM

## 2018-12-19 MED ORDER — AMOXICILLIN-POT CLAVULANATE 875-125 MG PO TABS: 1.0000 | ORAL_TABLET | Freq: Two times a day (BID) | ORAL | 0 refills | Status: AC

## 2018-12-19 NOTE — Progress Notes (Signed)
Virtual Visit via Video Note  I connected with@ on 12/19/18 at  2:30 PM EST by a video enabled telemedicine application 2/2 [id number] pandemic and verified that I am speaking with the correct person using two identifiers.  Location patient: home Location provider:work or home office Persons participating in the virtual visit: patient, provider  I discussed the limitations of evaluation and management by telemedicine and the availability of in person appointments. The patient expressed understanding and agreed to proceed.   HPI: Pt is a 68 yo with pmh sig for postablative, hypothyroidism, tobacco use, HTN seen by Dr. Raliegh Ip and assigned to Dr Ethlyn Gallery.  Seen today for acute visit.  Pt with productive cough, rhinorrhea, sore throat x 2 wks.  The sore throat has resolved.  Pt's husband recently sick as well- COVID negative.  Pt denies fever, HAs, ear pain/pressure, facial pain/pressure, n/v, diarrhea.  Pt took honey, lemon juice and vinegar for her symptoms.  Pt is a 1 ppd smoker.  Smoking less since sick.   Pt states OTC cough med, prednisone, and lidocaine "rev my heart up".  ROS: See pertinent positives and negatives per HPI.  Past Medical History:  Diagnosis Date  . Grave's disease   . HYPERTENSION 09/30/2007  . HYPOTHYROIDISM 09/30/2007  . Tobacco abuse     Past Surgical History:  Procedure Laterality Date  . APPENDECTOMY      Family History  Problem Relation Age of Onset  . Diabetes Mother   . Thyroid disease Mother   . Cancer Neg Hx        sister s- lung Ca, copd     Current Outpatient Medications:  .  bisoprolol-hydrochlorothiazide (ZIAC) 5-6.25 MG tablet, Take 1 tablet by mouth daily., Disp: 90 tablet, Rfl: 3 .  levothyroxine (SYNTHROID) 112 MCG tablet, Take 1 tablet (112 mcg total) by mouth daily., Disp: 90 tablet, Rfl: 3 .  potassium chloride (KLOR-CON) 10 MEQ tablet, Take 1 tablet by mouth once daily. **See PCP for management and refills**, Disp: 30 tablet, Rfl:  0  EXAM:  VITALS per patient if applicable: RR between 123456 bpm  GENERAL: alert, oriented, appears well and in no acute distress  HEENT: atraumatic, conjunctiva clear, no obvious abnormalities on inspection of external nose and ears  NECK: normal movements of the head and neck  LUNGS: Productive cough.  On inspection no signs of respiratory distress, breathing rate appears normal, no obvious gross SOB, gasping or wheezing  CV: no obvious cyanosis  MS: moves all visible extremities without noticeable abnormality  PSYCH/NEURO: pleasant and cooperative, no obvious depression or anxiety, speech and thought processing grossly intact  ASSESSMENT AND PLAN:  Discussed the following assessment and plan:  Bronchitis -given h/o nicotine use, will treat like COPD exacerbation -pt unable to take prednisone or OTC cough meds 2/2 tachycardia -will send in Rx for Augmentin BID x 7 d.   -Consider inhaler for continued symptoms -given precautions  Cigarette nicotine dependence without complication -Smoking cessation counseling greater than 3 minutes, less than 10 minutes -Pt encouraged to decrease the amount of cigarettes smokied daily -Continue to monitor  Pt encouraged to schedule f/u and TOC with new PCP, Dr. Ethlyn Gallery   I discussed the assessment and treatment plan with the patient. The patient was provided an opportunity to ask questions and all were answered. The patient agreed with the plan and demonstrated an understanding of the instructions.   The patient was advised to call back or seek an in-person evaluation if the symptoms worsen  or if the condition fails to improve as anticipated.  I provided 13 minutes of non-face-to-face time during this encounter.   Billie Ruddy, MD   This note is not being shared with the patient for the following reason: To prevent harm (release of this note would result in harm to the life or physical safety of the patient or another).

## 2019-01-03 ENCOUNTER — Other Ambulatory Visit: Payer: Self-pay | Admitting: Endocrinology

## 2019-08-02 DIAGNOSIS — S61411A Laceration without foreign body of right hand, initial encounter: Secondary | ICD-10-CM | POA: Diagnosis not present

## 2019-08-02 DIAGNOSIS — Z23 Encounter for immunization: Secondary | ICD-10-CM | POA: Diagnosis not present

## 2019-12-04 ENCOUNTER — Other Ambulatory Visit: Payer: Self-pay | Admitting: Endocrinology

## 2019-12-08 ENCOUNTER — Ambulatory Visit (INDEPENDENT_AMBULATORY_CARE_PROVIDER_SITE_OTHER): Payer: PPO | Admitting: Endocrinology

## 2019-12-08 ENCOUNTER — Other Ambulatory Visit: Payer: Self-pay

## 2019-12-08 ENCOUNTER — Encounter: Payer: Self-pay | Admitting: Endocrinology

## 2019-12-08 VITALS — BP 128/84 | HR 61 | Ht 63.0 in | Wt 182.6 lb

## 2019-12-08 DIAGNOSIS — E890 Postprocedural hypothyroidism: Secondary | ICD-10-CM

## 2019-12-08 DIAGNOSIS — I10 Essential (primary) hypertension: Secondary | ICD-10-CM

## 2019-12-08 DIAGNOSIS — Z23 Encounter for immunization: Secondary | ICD-10-CM | POA: Diagnosis not present

## 2019-12-08 LAB — COMPREHENSIVE METABOLIC PANEL
ALT: 12 U/L (ref 0–35)
AST: 20 U/L (ref 0–37)
Albumin: 3.8 g/dL (ref 3.5–5.2)
Alkaline Phosphatase: 91 U/L (ref 39–117)
BUN: 10 mg/dL (ref 6–23)
CO2: 31 mEq/L (ref 19–32)
Calcium: 9.2 mg/dL (ref 8.4–10.5)
Chloride: 103 mEq/L (ref 96–112)
Creatinine, Ser: 0.93 mg/dL (ref 0.40–1.20)
GFR: 63.34 mL/min (ref >60.00)
Glucose, Bld: 79 mg/dL (ref 70–99)
Potassium: 3.8 mEq/L (ref 3.5–5.1)
Sodium: 142 mEq/L (ref 135–145)
Total Bilirubin: 0.3 mg/dL (ref 0.2–1.2)
Total Protein: 7 g/dL (ref 6.0–8.3)

## 2019-12-08 LAB — T4, FREE: Free T4: 0.9 ng/dL (ref 0.60–1.60)

## 2019-12-08 LAB — TSH: TSH: 1.35 u[IU]/mL (ref 0.35–4.50)

## 2019-12-08 NOTE — Progress Notes (Signed)
Patient ID: Olivia Blanchard, female   DOB: 06-07-1950, 69 y.o.   MRN: [id number]   Reason for Appointment: Endocrinology follow-up visit    History of Present Illness:    The hypothyroidism was first diagnosed in 2002 after treatment of Graves' disease with I-131  The symptoms consistent with hypothyroidism are: none, has no fatigue or cold intolerance. Does have some weight gain in the last year  The treatments that the patient has taken include generic Synthroid.           The response to therapy has been  well-controlled symptoms. However she had gained weight after her I-131 ablation      Recent history:       In 12/2017 her dose was changed to 112 mcg and this was continued subsequently  She feels fairly good with her energy level No recent cold intolerance or hair loss Has gained back a little weight  She has been consistent with taking the levothyroxine tablet daily in the morning before breakfast  Wt Readings from Last 3 Encounters:  12/08/19 182 lb 9.6 oz (82.8 kg)  12/08/18 176 lb 3.2 oz (79.9 kg)  12/08/17 180 lb 6.4 oz (81.8 kg)     Lab Results  Component Value Date   TSH 2.28 12/08/2018   TSH 0.62 12/08/2017   TSH 1.61 12/08/2016   FREET4 1.05 12/08/2018   FREET4 1.03 12/08/2017   FREET4 0.87 12/08/2016   Graves eye disease: She has prominence of the left eye, is using OTC artificial tears as needed   HYPERTENSION: She has been on treatment since about 2005.   She has been taking Ziac 5 mg with consistently good control.  Has been taking this regularly On her last visit she was supplemented with potassium but she only took this for a short time  Does not monitor blood pressure at home Also has not established with a PCP  BP Readings from Last 3 Encounters:  12/08/19 128/84  12/08/18 130/70  12/08/17 120/72   Lab Results  Component Value Date   K 3.3 (L) 12/08/2018      Allergies as of 12/08/2019      Reactions   Codeine  Phosphate    Lidocaine Palpitations   "stuff the dentist uses to numb your teeth" causes tachycardia.   Prednisone Palpitations   tachycardia      Medication List       Accurate as of December 08, 2019 10:51 AM. If you have any questions, ask your nurse or doctor.        bisoprolol-hydrochlorothiazide 5-6.25 MG tablet Commonly known as: ZIAC Take 1 tablet by mouth daily.   levothyroxine 112 MCG tablet Commonly known as: SYNTHROID TAKE 1 TABLET BY MOUTH DAILY   potassium chloride 10 MEQ tablet Commonly known as: KLOR-CON Take 1 tablet by mouth once daily. **See PCP for management and refills**       Past Medical History:  Diagnosis Date  . Grave's disease   . HYPERTENSION 09/30/2007  . HYPOTHYROIDISM 09/30/2007  . Tobacco abuse     Past Surgical History:  Procedure Laterality Date  . APPENDECTOMY      Family History  Problem Relation Age of Onset  . Diabetes Mother   . Thyroid disease Mother   . Cancer Neg Hx        sister s- lung Ca, copd    Social History:  reports that she has been smoking. She has never used smokeless tobacco. No history  on file for alcohol use and drug use.  Allergies:  Allergies  Allergen Reactions  . Codeine Phosphate   . Lidocaine Palpitations    "stuff the dentist uses to numb your teeth" causes tachycardia.  . Prednisone Palpitations    tachycardia   ROS  Has had mild hypercholesterolemia previously, baseline LDL 130   Lab Results  Component Value Date   CHOL 182 12/08/2018   HDL 38.50 (L) 12/08/2018   LDLCALC 118 (H) 12/08/2018   TRIG 126.0 12/08/2018   CHOLHDL 5 12/08/2018   She has not seen her new PCP as yet   Examination:   BP 128/84   Pulse 61   Ht 5\' 3"  (1.6 m)   Wt 182 lb 9.6 oz (82.8 kg)   SpO2 99%   BMI 32.35 kg/m   She looks well   She has mild proptosis of the left eye with upper lid retraction Biceps reflexes show normal relaxation  No lower leg edema    Assessments/PLAN    Hypothyroidism, post ablative since 2002  She has been on a regimen of 112 mcg of levothyroxine daily since 12/19 She has been quite regular with this No recent symptoms of fatigue, cold intolerance or palpitations  Thyroid levels to be checked today  Hypertension: Well-controlled with bisoprolol HCT 5 mg daily for several years Potassium will be checked today and may need supplementation if still low However she needs to establish with her PCP  Influenza vaccine given  Given her handout on Covid vaccines and encouraged her to take this  Elayne Snare 12/08/2019, 10:51 AM    Elayne Snare

## 2019-12-11 NOTE — Progress Notes (Signed)
Please call to let patient know that the lab results are normal and no further action needed

## 2020-02-01 ENCOUNTER — Other Ambulatory Visit: Payer: Self-pay | Admitting: Endocrinology

## 2020-02-02 DIAGNOSIS — Z20822 Contact with and (suspected) exposure to covid-19: Secondary | ICD-10-CM | POA: Diagnosis not present

## 2020-02-02 DIAGNOSIS — U071 COVID-19: Secondary | ICD-10-CM | POA: Diagnosis not present

## 2020-02-13 ENCOUNTER — Telehealth: Payer: Self-pay | Admitting: Family Medicine

## 2020-02-13 NOTE — Telephone Encounter (Signed)
Tried calling patient to  schedule Medicare Annual Wellness Visit (AWV) either virtually or in office.  No answer  Last AWV no information  please schedule at anytime with [facility] Nurse Health Advisor 1 or 2   This should be a 45 minute visit. Patient also needs appointment with pcp last appointment 12/19/2018

## 2020-03-02 ENCOUNTER — Other Ambulatory Visit: Payer: Self-pay | Admitting: Endocrinology

## 2020-04-23 ENCOUNTER — Ambulatory Visit (INDEPENDENT_AMBULATORY_CARE_PROVIDER_SITE_OTHER): Payer: PPO

## 2020-04-23 ENCOUNTER — Other Ambulatory Visit: Payer: Self-pay

## 2020-04-23 DIAGNOSIS — Z0000 Encounter for general adult medical examination without abnormal findings: Secondary | ICD-10-CM | POA: Diagnosis not present

## 2020-04-23 NOTE — Progress Notes (Signed)
Virtual Visit via Telephone Note  I connected with  LOCKLYN HENRIQUEZ on 04/23/20 at  2:30 PM EDT by telephone and verified that I am speaking with the correct person using two identifiers.  Medicare Annual Wellness visit completed telephonically due to Covid-19 pandemic.   Persons participating in this call: This Health Coach and this patient.   Location: Patient: Home Provider: Office    I discussed the limitations, risks, security and privacy concerns of performing an evaluation and management service by telephone and the availability of in person appointments. The patient expressed understanding and agreed to proceed.  Unable to perform video visit due to video visit attempted and failed and/or patient does not have video capability.   Some vital signs may be absent or patient reported.   Willette Brace, LPN    Subjective:   SHARILYN GEISINGER is a 70 y.o. female who presents for an Initial Medicare Annual Wellness Visit.  Review of Systems     Cardiac Risk Factors include: hypertension;advanced age (>60men, >33 women);obesity (BMI >30kg/m2)     Objective:    There were no vitals filed for this visit. There is no height or weight on file to calculate BMI.  Advanced Directives 04/23/2020  Does Patient Have a Medical Advance Directive? No  Would patient like information on creating a medical advance directive? No - Patient declined    Current Medications (verified) Outpatient Encounter Medications as of 04/23/2020  Medication Sig  . bisoprolol-hydrochlorothiazide (ZIAC) 5-6.25 MG tablet TAKE 1 TABLET BY MOUTH DAILY  . levothyroxine (SYNTHROID) 112 MCG tablet TAKE 1 TABLET BY MOUTH DAILY  . [DISCONTINUED] potassium chloride (KLOR-CON) 10 MEQ tablet Take 1 tablet by mouth once daily. **See PCP for management and refills** (Patient not taking: No sig reported)   No facility-administered encounter medications on file as of 04/23/2020.    Allergies (verified) Codeine  phosphate, Lidocaine, and Prednisone   History: Past Medical History:  Diagnosis Date  . Grave's disease   . HYPERTENSION 09/30/2007  . HYPOTHYROIDISM 09/30/2007  . Tobacco abuse    Past Surgical History:  Procedure Laterality Date  . APPENDECTOMY     Family History  Problem Relation Age of Onset  . Diabetes Mother   . Thyroid disease Mother   . Cancer Neg Hx        sister s- lung Ca, copd   Social History   Socioeconomic History  . Marital status: Legally Separated    Spouse name: Not on file  . Number of children: Not on file  . Years of education: Not on file  . Highest education level: Not on file  Occupational History  . Not on file  Tobacco Use  . Smoking status: Current Every Day Smoker    Packs/day: 1.00  . Smokeless tobacco: Never Used  Substance and Sexual Activity  . Alcohol use: Never  . Drug use: Never  . Sexual activity: Not on file  Other Topics Concern  . Not on file  Social History Narrative  . Not on file   Social Determinants of Health   Financial Resource Strain: Low Risk   . Difficulty of Paying Living Expenses: Not hard at all  Food Insecurity: No Food Insecurity  . Worried About Charity fundraiser in the Last Year: Never true  . Ran Out of Food in the Last Year: Never true  Transportation Needs: No Transportation Needs  . Lack of Transportation (Medical): No  . Lack of Transportation (Non-Medical): No  Physical Activity: Inactive  . Days of Exercise per Week: 0 days  . Minutes of Exercise per Session: 0 min  Stress: No Stress Concern Present  . Feeling of Stress : Not at all  Social Connections: Moderately Isolated  . Frequency of Communication with Friends and Family: More than three times a week  . Frequency of Social Gatherings with Friends and Family: More than three times a week  . Attends Religious Services: Never  . Active Member of Clubs or Organizations: No  . Attends Archivist Meetings: Never  . Marital  Status: Married    Tobacco Counseling Ready to quit: Not Answered Counseling given: Not Answered   Clinical Intake:  Pre-visit preparation completed: Yes  Pain : No/denies pain     BMI - recorded: 32.35 Nutritional Status: BMI > 30  Obese Nutritional Risks: None Diabetes: No  How often do you need to have someone help you when you read instructions, pamphlets, or other written materials from your doctor or pharmacy?: 1 - Never  Diabetic?No  Interpreter Needed?: No  Information entered by :: Charlott Rakes, LPN   Activities of Daily Living In your present state of health, do you have any difficulty performing the following activities: 04/23/2020  Hearing? N  Vision? N  Difficulty concentrating or making decisions? N  Walking or climbing stairs? N  Dressing or bathing? N  Doing errands, shopping? N  Preparing Food and eating ? N  Using the Toilet? N  In the past six months, have you accidently leaked urine? N  Do you have problems with loss of bowel control? N  Managing your Medications? N  Managing your Finances? N  Housekeeping or managing your Housekeeping? N  Some recent data might be hidden    Patient Care Team: Caren Macadam, MD as PCP - General (Family Medicine)  Indicate any recent Medical Services you may have received from other than [facility] providers in the past year (date may be approximate).     Assessment:   This is a routine wellness examination for Teliyah.  Hearing/Vision screen  Hearing Screening   125Hz  250Hz  500Hz  1000Hz  2000Hz  3000Hz  4000Hz  6000Hz  8000Hz   Right ear:           Left ear:           Comments: Pt denies any hearing issue  Vision Screening Comments: Pt stated she will follow up for eye exams   Dietary issues and exercise activities discussed: Current Exercise Habits: The patient does not participate in regular exercise at present  Goals    . Patient Stated     None at this time       Depression Screen PHQ 2/9  Scores 04/23/2020  PHQ - 2 Score 0    Fall Risk Fall Risk  04/23/2020  Falls in the past year? 0  Number falls in past yr: 0  Injury with Fall? 0  Follow up Falls prevention discussed    FALL RISK PREVENTION PERTAINING TO THE HOME:  Any stairs in or around the home? Yes  If so, are there any without handrails? Yes  Home free of loose throw rugs in walkways, pet beds, electrical cords, etc? Yes  Adequate lighting in your home to reduce risk of falls? Yes   ASSISTIVE DEVICES UTILIZED TO PREVENT FALLS:  Life alert? No  Use of a cane, walker or w/c? No  Grab bars in the bathroom? No  Shower chair or bench in shower? No  Elevated toilet  seat or a handicapped toilet? Yes  TIMED UP AND GO:  Was the test performed? No     Cognitive Function:     6CIT Screen 04/23/2020  What Year? 0 points  What month? 0 points  Count back from 20 0 points  Months in reverse 0 points  Repeat phrase 0 points    Immunizations Immunization History  Administered Date(s) Administered  . Influenza Inj Mdck Quad With Preservative 10/29/2017  . Influenza, High Dose Seasonal PF 10/03/2018, 12/08/2019  . Influenza,inj,Quad PF,6+ Mos 09/28/2013  . Tdap 08/02/2019    TDAP status: Up to date  Flu Vaccine status: Up to date  Pneumococcal vaccine status: Due, Education has been provided regarding the importance of this vaccine. Advised may receive this vaccine at local pharmacy or Health Dept. Aware to provide a copy of the vaccination record if obtained from local pharmacy or Health Dept. Verbalized acceptance and understanding.  Covid-19 vaccine status: Declined, Education has been provided regarding the importance of this vaccine but patient still declined. Advised may receive this vaccine at local pharmacy or Health Dept.or vaccine clinic. Aware to provide a copy of the vaccination record if obtained from local pharmacy or Health Dept. Verbalized acceptance and understanding.  Qualifies for  Shingles Vaccine? Yes   Zostavax completed No   Shingrix Completed?: No.    Education has been provided regarding the importance of this vaccine. Patient has been advised to call insurance company to determine out of pocket expense if they have not yet received this vaccine. Advised may also receive vaccine at local pharmacy or Health Dept. Verbalized acceptance and understanding.  Screening Tests Health Maintenance  Topic Date Due  . Hepatitis C Screening  Never done  . PNA vac Low Risk Adult (1 of 2 - PCV13) Never done  . COVID-19 Vaccine (1) 05/09/2020 (Originally 11/05/1955)  . MAMMOGRAM  04/23/2021 (Originally 11/05/2001)  . DEXA SCAN  04/23/2021 (Originally 11/05/2016)  . COLONOSCOPY (Pts 45-63yrs Insurance coverage will need to be confirmed)  04/23/2021 (Originally 11/05/1996)  . INFLUENZA VACCINE  08/05/2020  . TETANUS/TDAP  08/01/2029  . HPV VACCINES  Aged Out    Health Maintenance  Health Maintenance Due  Topic Date Due  . Hepatitis C Screening  Never done  . PNA vac Low Risk Adult (1 of 2 - PCV13) Never done    Colorectal cancer screening: No longer required.  per pt   Mammogram status: No longer required due to pt request.  Bone Density : Declined and discussed   Additional Screening:  Hepatitis C Screening: does qualify;  Vision Screening: Recommended annual ophthalmology exams for early detection of glaucoma and other disorders of the eye. Is the patient up to date with their annual eye exam?  No  Who is the provider or what is the name of the office in which the patient attends annual eye exams? Pt will make an appt  If pt is not established with a provider, would they like to be referred to a provider to establish care? No .   Dental Screening: Recommended annual dental exams for proper oral hygiene  Community Resource Referral / Chronic Care Management: CRR required this visit?  No   CCM required this visit?  No      Plan:     I have personally  reviewed and noted the following in the patient's chart:   . Medical and social history . Use of alcohol, tobacco or illicit drugs  . Current medications and supplements .  Functional ability and status . Nutritional status . Physical activity . Advanced directives . List of other physicians . Hospitalizations, surgeries, and ER visits in previous 12 months . Vitals . Screenings to include cognitive, depression, and falls . Referrals and appointments  In addition, I have reviewed and discussed with patient certain preventive protocols, quality metrics, and best practice recommendations. A written personalized care plan for preventive services as well as general preventive health recommendations were provided to patient.     Willette Brace, LPN   [id number]   Nurse Notes: None

## 2020-04-23 NOTE — Patient Instructions (Addendum)
Olivia Blanchard , Thank you for taking time to come for your Medicare Wellness Visit. I appreciate your ongoing commitment to your health goals. Please review the following plan we discussed and let me know if I can assist you in the future.   Screening recommendations/referrals: Colonoscopy: Declined  Mammogram: Declined  Bone Density: Declined and discussed Recommended yearly ophthalmology/optometry visit for glaucoma screening and checkup Recommended yearly dental visit for hygiene and checkup  Vaccinations: Influenza vaccine: Up to date Pneumococcal vaccine: Due and discussed Tdap vaccine: Up to date Shingles vaccine: Shingrix discussed. Please contact your pharmacy for coverage information.    Covid-19:Declined and discussed  Advanced directives: Advance directive discussed with you today. Even though you declined this today please call our office should you change your mind and we can give you the proper paperwork for you to fill out.  Conditions/risks identified: None at this time   Next appointment: Follow up in one year for your annual wellness visit    Preventive Care 65 Years and Older, Female Preventive care refers to lifestyle choices and visits with your health care provider that can promote health and wellness. What does preventive care include?  A yearly physical exam. This is also called an annual well check.  Dental exams once or twice a year.  Routine eye exams. Ask your health care provider how often you should have your eyes checked.  Personal lifestyle choices, including:  Daily care of your teeth and gums.  Regular physical activity.  Eating a healthy diet.  Avoiding tobacco and drug use.  Limiting alcohol use.  Practicing safe sex.  Taking low-dose aspirin every day.  Taking vitamin and mineral supplements as recommended by your health care provider. What happens during an annual well check? The services and screenings done by your health care  provider during your annual well check will depend on your age, overall health, lifestyle risk factors, and family history of disease. Counseling  Your health care provider may ask you questions about your:  Alcohol use.  Tobacco use.  Drug use.  Emotional well-being.  Home and relationship well-being.  Sexual activity.  Eating habits.  History of falls.  Memory and ability to understand (cognition).  Work and work Statistician.  Reproductive health. Screening  You may have the following tests or measurements:  Height, weight, and BMI.  Blood pressure.  Lipid and cholesterol levels. These may be checked every 5 years, or more frequently if you are over 61 years old.  Skin check.  Lung cancer screening. You may have this screening every year starting at age 11 if you have a 30-pack-year history of smoking and currently smoke or have quit within the past 15 years.  Fecal occult blood test (FOBT) of the stool. You may have this test every year starting at age 31.  Flexible sigmoidoscopy or colonoscopy. You may have a sigmoidoscopy every 5 years or a colonoscopy every 10 years starting at age 38.  Hepatitis C blood test.  Hepatitis B blood test.  Sexually transmitted disease (STD) testing.  Diabetes screening. This is done by checking your blood sugar (glucose) after you have not eaten for a while (fasting). You may have this done every 1-3 years.  Bone density scan. This is done to screen for osteoporosis. You may have this done starting at age 84.  Mammogram. This may be done every 1-2 years. Talk to your health care provider about how often you should have regular mammograms. Talk with your health care provider  about your test results, treatment options, and if necessary, the need for more tests. Vaccines  Your health care provider may recommend certain vaccines, such as:  Influenza vaccine. This is recommended every year.  Tetanus, diphtheria, and acellular  pertussis (Tdap, Td) vaccine. You may need a Td booster every 10 years.  Zoster vaccine. You may need this after age 18.  Pneumococcal 13-valent conjugate (PCV13) vaccine. One dose is recommended after age 64.  Pneumococcal polysaccharide (PPSV23) vaccine. One dose is recommended after age 54. Talk to your health care provider about which screenings and vaccines you need and how often you need them. This information is not intended to replace advice given to you by your health care provider. Make sure you discuss any questions you have with your health care provider. Document Released: 01/18/2015 Document Revised: 09/11/2015 Document Reviewed: 10/23/2014 Elsevier Interactive Patient Education  2017 [place] Prevention in the Home Falls can cause injuries. They can happen to people of all ages. There are many things you can do to make your home safe and to help prevent falls. What can I do on the outside of my home?  Regularly fix the edges of walkways and driveways and fix any cracks.  Remove anything that might make you trip as you walk through a door, such as a raised step or threshold.  Trim any bushes or trees on the path to your home.  Use bright outdoor lighting.  Clear any walking paths of anything that might make someone trip, such as rocks or tools.  Regularly check to see if handrails are loose or broken. Make sure that both sides of any steps have handrails.  Any raised decks and porches should have guardrails on the edges.  Have any leaves, snow, or ice cleared regularly.  Use sand or salt on walking paths during winter.  Clean up any spills in your garage right away. This includes oil or grease spills. What can I do in the bathroom?  Use night lights.  Install grab bars by the toilet and in the tub and shower. Do not use towel bars as grab bars.  Use non-skid mats or decals in the tub or shower.  If you need to sit down in the shower, use a plastic,  non-slip stool.  Keep the floor dry. Clean up any water that spills on the floor as soon as it happens.  Remove soap buildup in the tub or shower regularly.  Attach bath mats securely with double-sided non-slip rug tape.  Do not have throw rugs and other things on the floor that can make you trip. What can I do in the bedroom?  Use night lights.  Make sure that you have a light by your bed that is easy to reach.  Do not use any sheets or blankets that are too big for your bed. They should not hang down onto the floor.  Have a firm chair that has side arms. You can use this for support while you get dressed.  Do not have throw rugs and other things on the floor that can make you trip. What can I do in the kitchen?  Clean up any spills right away.  Avoid walking on wet floors.  Keep items that you use a lot in easy-to-reach places.  If you need to reach something above you, use a strong step stool that has a grab bar.  Keep electrical cords out of the way.  Do not use floor polish or  wax that makes floors slippery. If you must use wax, use non-skid floor wax.  Do not have throw rugs and other things on the floor that can make you trip. What can I do with my stairs?  Do not leave any items on the stairs.  Make sure that there are handrails on both sides of the stairs and use them. Fix handrails that are broken or loose. Make sure that handrails are as long as the stairways.  Check any carpeting to make sure that it is firmly attached to the stairs. Fix any carpet that is loose or worn.  Avoid having throw rugs at the top or bottom of the stairs. If you do have throw rugs, attach them to the floor with carpet tape.  Make sure that you have a light switch at the top of the stairs and the bottom of the stairs. If you do not have them, ask someone to add them for you. What else can I do to help prevent falls?  Wear shoes that:  Do not have high heels.  Have rubber  bottoms.  Are comfortable and fit you well.  Are closed at the toe. Do not wear sandals.  If you use a stepladder:  Make sure that it is fully opened. Do not climb a closed stepladder.  Make sure that both sides of the stepladder are locked into place.  Ask someone to hold it for you, if possible.  Clearly mark and make sure that you can see:  Any grab bars or handrails.  First and last steps.  Where the edge of each step is.  Use tools that help you move around (mobility aids) if they are needed. These include:  Canes.  Walkers.  Scooters.  Crutches.  Turn on the lights when you go into a dark area. Replace any light bulbs as soon as they burn out.  Set up your furniture so you have a clear path. Avoid moving your furniture around.  If any of your floors are uneven, fix them.  If there are any pets around you, be aware of where they are.  Review your medicines with your doctor. Some medicines can make you feel dizzy. This can increase your chance of falling. Ask your doctor what other things that you can do to help prevent falls. This information is not intended to replace advice given to you by your health care provider. Make sure you discuss any questions you have with your health care provider. Document Released: 10/18/2008 Document Revised: 05/30/2015 Document Reviewed: 01/26/2014 Elsevier Interactive Patient Education  2017 [employer].

## 2020-05-31 ENCOUNTER — Other Ambulatory Visit: Payer: Self-pay | Admitting: Endocrinology

## 2020-08-30 ENCOUNTER — Other Ambulatory Visit: Payer: Self-pay | Admitting: Endocrinology

## 2020-09-10 ENCOUNTER — Encounter: Payer: Self-pay | Admitting: Family Medicine

## 2020-09-10 ENCOUNTER — Telehealth: Payer: Self-pay | Admitting: Family Medicine

## 2020-09-10 ENCOUNTER — Telehealth (INDEPENDENT_AMBULATORY_CARE_PROVIDER_SITE_OTHER): Payer: PPO | Admitting: Family Medicine

## 2020-09-10 ENCOUNTER — Other Ambulatory Visit: Payer: Self-pay

## 2020-09-10 DIAGNOSIS — R110 Nausea: Secondary | ICD-10-CM | POA: Diagnosis not present

## 2020-09-10 DIAGNOSIS — U071 COVID-19: Secondary | ICD-10-CM | POA: Diagnosis not present

## 2020-09-10 MED ORDER — ONDANSETRON HCL 4 MG PO TABS: 4.0000 mg | ORAL_TABLET | Freq: Three times a day (TID) | ORAL | 0 refills | Status: DC | PRN

## 2020-09-10 NOTE — Telephone Encounter (Signed)
Spoke with the patient and informed her a visit is needed for evaluation prior to medications being sent in.  Appt scheduled for today with Dr Maudie Mercury at [facility].

## 2020-09-10 NOTE — Progress Notes (Signed)
Virtual Visit via Telephone Note  I connected with Olivia Blanchard on 09/10/20 at  4:00 PM EDT by telephone and verified that I am speaking with the correct person using two identifiers.   I discussed the limitations, risks, security and privacy concerns of performing an evaluation and management service by telephone and the availability of in person appointments. I also discussed with the patient that there may be a patient responsible charge related to this service. The patient expressed understanding and agreed to proceed.  Location patient: home, [place] Location provider: work or home office Participants present for the call: patient, provider Patient did not have a visit with me in the prior 7 days to address this/these issue(s).   History of Present Illness:  Acute telemedicine visit for Covid19/Nausea and weakness: -Onset: started when got sick with covid19 3 weeks ago -she felt like she had the flu initially, but most symptoms cleared up other than those mentioned below -Symptoms include: nausea, poor appetite, feeling tired -some things taste good like peaches, oatmeal, etc. -Denies: fever, abd pain, diarrhea, melena, hematochezia, vomiting, inability to eat/drink/get out of bed -Pertinent past medical history:see below -Pertinent medication allergies:  Allergies  Allergen Reactions   Codeine Phosphate    Lidocaine Palpitations    "stuff the dentist uses to numb your teeth" causes tachycardia.   Prednisone Palpitations    tachycardia  -COVID-19 vaccine status: not vaccinated, did not take treatment  Past Medical History:  Diagnosis Date   Grave's disease    HYPERTENSION 09/30/2007   HYPOTHYROIDISM 09/30/2007   Tobacco abuse       Observations/Objective: Patient sounds cheerful and well on the phone. I do not appreciate any SOB. Speech and thought processing are grossly intact. Patient reported vitals:  Assessment and Plan:  COVID-19  Nausea  -we discussed  possible serious and likely etiologies, options for evaluation and workup, limitations of telemedicine visit vs in person visit, treatment, treatment risks and precautions. Pt prefers to treat via telemedicine empirically rather than in person at this moment.  Unfortunately, she is still having some symptoms following a recent COVID illness.  We did talk about the difference between long COVID, acute and subacute symptoms.  She has opted to try some Zofran if needed, avoidance of dairy and red meat, oral hydration and taking it easy.  I did advise that she follow-up with her primary care office in a week or 2 to see how she is doing.  Advised she may need a recheck on her thyroid and other evaluations if she is still feeling poorly. Scheduled follow up with PCP offered:  Sent message to schedulers to assist and advised patient to contact PCP office to schedule if does not receive call back in next 24 hours. Advised to seek prompt in person care if worsening, new symptoms arise, or if is not improving with treatment. Advised of options for inperson care in case PCP office not available. Did let the patient know that I only do telemedicine shifts for [facility] on Tuesdays and Thursdays and advised a follow up visit with PCP or at an [facility] if has further questions or concerns.   Follow Up Instructions:  I did not refer this patient for an OV with me in the next 24 hours for this/these issue(s).  I discussed the assessment and treatment plan with the patient. The patient was provided an opportunity to ask questions and all were answered. The patient agreed with the plan and demonstrated an understanding of the  instructions.   I spent 14 minutes on the date of this visit in the care of this patient. See summary of tasks completed to properly care for this patient in the detailed notes above which also included counseling of above, review of PMH, medications, allergies, evaluation of the patient and ordering and/or   instructing patient on testing and care options.     Lucretia Kern, DO

## 2020-09-10 NOTE — Patient Instructions (Addendum)
-  I sent the medication(s) we discussed to your pharmacy: Meds ordered this encounter  Medications   ondansetron (ZOFRAN) 4 MG tablet    Sig: Take 1 tablet (4 mg total) by mouth every 8 (eight) hours as needed for nausea or vomiting.    Dispense:  20 tablet    Refill:  0   Please schedule a follow-up visit with your primary care office in 2 weeks in person.  Please drink plenty of water and eat a healthy diet.  Avoid dairy and red meat.  Please get gentle activity, you can take short walks, but avoid strenuous activity and take naps if you are feeling tired.  I hope you are feeling better soon!  Seek in person care promptly sooner if your symptoms worsen, new concerns arise or you are not improving with treatment.  It was nice to meet you today. I help [facility] out with telemedicine visits on Tuesdays and Thursdays and am available for visits on those days. If you have any concerns or questions following this visit please schedule a follow up visit with your Primary Care doctor or seek care at a local urgent care clinic to avoid delays in care.

## 2020-09-10 NOTE — Telephone Encounter (Signed)
Patient states she has covid and has a problem with nausea. Patient states she can't eat anything heavy because of the nausea. Would like something to be called in to help.    Please send to [facility], [place] Phone:  [phone]  Fax:  [phone]      Good callback number [phone]    Please Advise

## 2020-09-23 ENCOUNTER — Telehealth: Payer: Self-pay | Admitting: Family Medicine

## 2020-09-23 ENCOUNTER — Other Ambulatory Visit: Payer: Self-pay

## 2020-09-23 ENCOUNTER — Encounter: Payer: Self-pay | Admitting: Family Medicine

## 2020-09-23 ENCOUNTER — Telehealth (INDEPENDENT_AMBULATORY_CARE_PROVIDER_SITE_OTHER): Payer: PPO | Admitting: Family Medicine

## 2020-09-23 DIAGNOSIS — U071 COVID-19: Secondary | ICD-10-CM | POA: Diagnosis not present

## 2020-09-23 NOTE — Progress Notes (Signed)
   Subjective:    Patient ID: Olivia Blanchard, female    DOB: 03-Jun-1950, 70 y.o.   MRN: [id number]  HPI Virtual Visit via Telephone Note  I connected with the patient on 09/23/20 at  2:45 PM EDT by telephone and verified that I am speaking with the correct person using two identifiers.   I discussed the limitations, risks, security and privacy concerns of performing an evaluation and management service by telephone and the availability of in person appointments. I also discussed with the patient that there may be a patient responsible charge related to this service. The patient expressed understanding and agreed to proceed.  Location patient: home Location provider: work or home office Participants present for the call: patient, provider Patient did not have a visit in the prior 7 days to address this/these issue(s).   History of Present Illness: Here for advice about post-Covid symptoms. She tested positive for the Covid-19 virus about 4 weeks ago. Most of these symptoms resolved quickly, but she has had lingering fatigue and a very poor appetite. She says her taste and smell are intact, and she finds herself wanting to eat. Then after a bite or two, she can't make herself eat any more. No abdominal pain. Some nausea but no vomiting. She had a virtual visit with Dr. Maudie Mercury recently, and she sent in some Zofran which helps the nausea.    Observations/Objective: Patient sounds cheerful and well on the phone. I do not appreciate any SOB. Speech and thought processing are grossly intact. Patient reported vitals:  Assessment and Plan: She has a poor appetite form the Covid infection, and I think th lack of caloric intake is the main source of her weakness. I suggested she drink 2-3 bottles of Ensure every day. I think when she gets more calories into her system, she will feel better. Recheck as needed.  Alysia Penna, MD   Follow Up Instructions:     [phone] 5-10 [phone] 11-20 [phone]  21-30 I did not refer this patient for an OV in the next 24 hours for this/these issue(s).  I discussed the assessment and treatment plan with the patient. The patient was provided an opportunity to ask questions and all were answered. The patient agreed with the plan and demonstrated an understanding of the instructions.   The patient was advised to call back or seek an in-person evaluation if the symptoms worsen or if the condition fails to improve as anticipated.  I provided 24 minutes of non-face-to-face time during this encounter.   Alysia Penna, MD     Review of Systems     Objective:   Physical Exam        Assessment & Plan:

## 2020-09-23 NOTE — Telephone Encounter (Signed)
Patient called because she is extremely weak and wants advice on anything she may be able to buy that will help with the energy. Patient states she hasn't ate because she is not hungry and feels this is contributing to the weakness     Good callback number is [phone]    Please Advise

## 2020-09-23 NOTE — Telephone Encounter (Signed)
Spoke with the patient and scheduled a virtual visit today with Dr Sarajane Jews for evaluation.

## 2020-09-24 DIAGNOSIS — Z7409 Other reduced mobility: Secondary | ICD-10-CM | POA: Diagnosis not present

## 2020-09-24 DIAGNOSIS — I700 Atherosclerosis of aorta: Secondary | ICD-10-CM | POA: Diagnosis not present

## 2020-09-24 DIAGNOSIS — R112 Nausea with vomiting, unspecified: Secondary | ICD-10-CM | POA: Diagnosis not present

## 2020-09-24 DIAGNOSIS — I10 Essential (primary) hypertension: Secondary | ICD-10-CM | POA: Diagnosis not present

## 2020-09-24 DIAGNOSIS — Z79899 Other long term (current) drug therapy: Secondary | ICD-10-CM | POA: Diagnosis not present

## 2020-09-24 DIAGNOSIS — R262 Difficulty in walking, not elsewhere classified: Secondary | ICD-10-CM | POA: Diagnosis not present

## 2020-09-24 DIAGNOSIS — Z885 Allergy status to narcotic agent status: Secondary | ICD-10-CM | POA: Diagnosis not present

## 2020-09-24 DIAGNOSIS — Z79890 Hormone replacement therapy (postmenopausal): Secondary | ICD-10-CM | POA: Diagnosis not present

## 2020-09-24 DIAGNOSIS — Z716 Tobacco abuse counseling: Secondary | ICD-10-CM | POA: Diagnosis not present

## 2020-09-24 DIAGNOSIS — E876 Hypokalemia: Secondary | ICD-10-CM | POA: Diagnosis not present

## 2020-09-24 DIAGNOSIS — R531 Weakness: Secondary | ICD-10-CM | POA: Diagnosis not present

## 2020-09-24 DIAGNOSIS — S0990XA Unspecified injury of head, initial encounter: Secondary | ICD-10-CM | POA: Diagnosis not present

## 2020-09-24 DIAGNOSIS — R9431 Abnormal electrocardiogram [ECG] [EKG]: Secondary | ICD-10-CM | POA: Diagnosis not present

## 2020-09-24 DIAGNOSIS — Z28310 Unvaccinated for covid-19: Secondary | ICD-10-CM | POA: Diagnosis not present

## 2020-09-24 DIAGNOSIS — Z20822 Contact with and (suspected) exposure to covid-19: Secondary | ICD-10-CM | POA: Diagnosis not present

## 2020-09-24 DIAGNOSIS — I959 Hypotension, unspecified: Secondary | ICD-10-CM | POA: Diagnosis not present

## 2020-09-24 DIAGNOSIS — E871 Hypo-osmolality and hyponatremia: Secondary | ICD-10-CM | POA: Diagnosis not present

## 2020-09-24 DIAGNOSIS — R197 Diarrhea, unspecified: Secondary | ICD-10-CM | POA: Diagnosis not present

## 2020-09-24 DIAGNOSIS — R634 Abnormal weight loss: Secondary | ICD-10-CM | POA: Diagnosis not present

## 2020-09-24 DIAGNOSIS — E039 Hypothyroidism, unspecified: Secondary | ICD-10-CM | POA: Diagnosis not present

## 2020-09-24 DIAGNOSIS — W19XXXA Unspecified fall, initial encounter: Secondary | ICD-10-CM | POA: Diagnosis not present

## 2020-09-24 DIAGNOSIS — Z9181 History of falling: Secondary | ICD-10-CM | POA: Diagnosis not present

## 2020-09-24 DIAGNOSIS — E079 Disorder of thyroid, unspecified: Secondary | ICD-10-CM | POA: Diagnosis not present

## 2020-09-24 DIAGNOSIS — R2689 Other abnormalities of gait and mobility: Secondary | ICD-10-CM | POA: Diagnosis not present

## 2020-09-24 DIAGNOSIS — F17210 Nicotine dependence, cigarettes, uncomplicated: Secondary | ICD-10-CM | POA: Diagnosis not present

## 2020-09-24 DIAGNOSIS — R55 Syncope and collapse: Secondary | ICD-10-CM | POA: Diagnosis not present

## 2020-09-25 DIAGNOSIS — E871 Hypo-osmolality and hyponatremia: Secondary | ICD-10-CM | POA: Diagnosis not present

## 2020-09-25 DIAGNOSIS — R531 Weakness: Secondary | ICD-10-CM | POA: Diagnosis not present

## 2020-09-25 DIAGNOSIS — E039 Hypothyroidism, unspecified: Secondary | ICD-10-CM | POA: Diagnosis not present

## 2020-09-25 DIAGNOSIS — R55 Syncope and collapse: Secondary | ICD-10-CM | POA: Diagnosis not present

## 2020-09-25 DIAGNOSIS — I10 Essential (primary) hypertension: Secondary | ICD-10-CM | POA: Diagnosis not present

## 2020-09-25 DIAGNOSIS — Z79899 Other long term (current) drug therapy: Secondary | ICD-10-CM | POA: Diagnosis not present

## 2020-09-25 DIAGNOSIS — E876 Hypokalemia: Secondary | ICD-10-CM | POA: Diagnosis not present

## 2020-09-25 DIAGNOSIS — Z716 Tobacco abuse counseling: Secondary | ICD-10-CM | POA: Diagnosis not present

## 2020-09-26 DIAGNOSIS — R55 Syncope and collapse: Secondary | ICD-10-CM | POA: Diagnosis not present

## 2020-09-26 DIAGNOSIS — K5730 Diverticulosis of large intestine without perforation or abscess without bleeding: Secondary | ICD-10-CM | POA: Diagnosis not present

## 2020-09-26 DIAGNOSIS — E871 Hypo-osmolality and hyponatremia: Secondary | ICD-10-CM | POA: Diagnosis not present

## 2020-09-26 DIAGNOSIS — R197 Diarrhea, unspecified: Secondary | ICD-10-CM | POA: Diagnosis not present

## 2020-09-26 DIAGNOSIS — E876 Hypokalemia: Secondary | ICD-10-CM | POA: Diagnosis not present

## 2020-09-26 DIAGNOSIS — Z720 Tobacco use: Secondary | ICD-10-CM | POA: Diagnosis not present

## 2020-09-26 DIAGNOSIS — R0902 Hypoxemia: Secondary | ICD-10-CM | POA: Diagnosis not present

## 2020-09-26 DIAGNOSIS — R6881 Early satiety: Secondary | ICD-10-CM | POA: Diagnosis not present

## 2020-09-26 DIAGNOSIS — I10 Essential (primary) hypertension: Secondary | ICD-10-CM | POA: Diagnosis not present

## 2020-09-26 DIAGNOSIS — J90 Pleural effusion, not elsewhere classified: Secondary | ICD-10-CM | POA: Diagnosis not present

## 2020-09-26 DIAGNOSIS — Z79899 Other long term (current) drug therapy: Secondary | ICD-10-CM | POA: Diagnosis not present

## 2020-09-26 DIAGNOSIS — E890 Postprocedural hypothyroidism: Secondary | ICD-10-CM | POA: Diagnosis not present

## 2020-09-27 ENCOUNTER — Telehealth: Payer: Self-pay

## 2020-09-27 DIAGNOSIS — E871 Hypo-osmolality and hyponatremia: Secondary | ICD-10-CM | POA: Diagnosis not present

## 2020-09-27 DIAGNOSIS — R262 Difficulty in walking, not elsewhere classified: Secondary | ICD-10-CM | POA: Diagnosis not present

## 2020-09-27 DIAGNOSIS — M6281 Muscle weakness (generalized): Secondary | ICD-10-CM | POA: Diagnosis not present

## 2020-09-27 DIAGNOSIS — E876 Hypokalemia: Secondary | ICD-10-CM | POA: Diagnosis not present

## 2020-09-27 NOTE — Telephone Encounter (Signed)
Transition Care Management Unsuccessful Follow-up Telephone Call  Date of discharge and from where:   [facility]  09/26/2020  Attempts:  1st Attempt  Reason for unsuccessful TCM follow-up call:  Unable to reach patient

## 2020-09-30 ENCOUNTER — Telehealth: Payer: Self-pay | Admitting: Family Medicine

## 2020-09-30 DIAGNOSIS — M6281 Muscle weakness (generalized): Secondary | ICD-10-CM | POA: Diagnosis not present

## 2020-09-30 DIAGNOSIS — R262 Difficulty in walking, not elsewhere classified: Secondary | ICD-10-CM | POA: Diagnosis not present

## 2020-09-30 DIAGNOSIS — E876 Hypokalemia: Secondary | ICD-10-CM | POA: Diagnosis not present

## 2020-09-30 DIAGNOSIS — E871 Hypo-osmolality and hyponatremia: Secondary | ICD-10-CM | POA: Diagnosis not present

## 2020-09-30 NOTE — Telephone Encounter (Signed)
Transition Care Management Unsuccessful Follow-up Telephone Call  Date of discharge and from where:  [facility] 09/26/20  Attempts:  2nd Attempt  Reason for unsuccessful TCM follow-up call:  Unable to reach patient

## 2020-09-30 NOTE — Telephone Encounter (Signed)
I have not seen this patient before? Although I am listed as PCP. And there are no future visits with me??   She can see another provider. I do have same day visits that can be used for hospital follow up if needed. If feeling any worse she needs to be seen today (if no appointments would suggest urgent care/er)

## 2020-09-30 NOTE — Telephone Encounter (Signed)
PT called to relay that she needs to be seen for a hospital f/u asap due to the hospital told her to. They also told her that her potassium is low but did not prescribe anything so she has concerns about that. Please advise how she can be seen for a hosp f/u and what can be done about the potassium. PT was also wanting to know if a different Dr can see her for the hosp f/u.

## 2020-09-30 NOTE — Telephone Encounter (Signed)
Spoke with the patient, informed her of the message below and the patient declined going to the ER or an urgent care.  Patient was advised to arrive Wednesday at 10:15am for an appt.  Message sent to [employer], Engineer, building services to add this in the same day slot at 10:30am.

## 2020-10-02 ENCOUNTER — Other Ambulatory Visit: Payer: Self-pay | Admitting: Family Medicine

## 2020-10-02 ENCOUNTER — Other Ambulatory Visit: Payer: Self-pay

## 2020-10-02 ENCOUNTER — Encounter: Payer: Self-pay | Admitting: Family Medicine

## 2020-10-02 ENCOUNTER — Ambulatory Visit (INDEPENDENT_AMBULATORY_CARE_PROVIDER_SITE_OTHER): Payer: PPO | Admitting: Family Medicine

## 2020-10-02 VITALS — BP 102/70 | HR 74 | Temp 97.6°F | Ht 63.0 in | Wt 149.9 lb

## 2020-10-02 DIAGNOSIS — E890 Postprocedural hypothyroidism: Secondary | ICD-10-CM

## 2020-10-02 DIAGNOSIS — R7989 Other specified abnormal findings of blood chemistry: Secondary | ICD-10-CM

## 2020-10-02 DIAGNOSIS — Z13220 Encounter for screening for lipoid disorders: Secondary | ICD-10-CM | POA: Diagnosis not present

## 2020-10-02 DIAGNOSIS — R790 Abnormal level of blood mineral: Secondary | ICD-10-CM | POA: Diagnosis not present

## 2020-10-02 DIAGNOSIS — R911 Solitary pulmonary nodule: Secondary | ICD-10-CM

## 2020-10-02 DIAGNOSIS — Z131 Encounter for screening for diabetes mellitus: Secondary | ICD-10-CM | POA: Diagnosis not present

## 2020-10-02 DIAGNOSIS — K3189 Other diseases of stomach and duodenum: Secondary | ICD-10-CM

## 2020-10-02 DIAGNOSIS — I10 Essential (primary) hypertension: Secondary | ICD-10-CM | POA: Diagnosis not present

## 2020-10-02 DIAGNOSIS — B370 Candidal stomatitis: Secondary | ICD-10-CM | POA: Diagnosis not present

## 2020-10-02 DIAGNOSIS — D649 Anemia, unspecified: Secondary | ICD-10-CM | POA: Diagnosis not present

## 2020-10-02 DIAGNOSIS — I77810 Thoracic aortic ectasia: Secondary | ICD-10-CM

## 2020-10-02 DIAGNOSIS — R634 Abnormal weight loss: Secondary | ICD-10-CM

## 2020-10-02 LAB — COMPREHENSIVE METABOLIC PANEL
ALT: 13 U/L (ref 0–35)
AST: 19 U/L (ref 0–37)
Albumin: 3.1 g/dL — ABNORMAL LOW (ref 3.5–5.2)
Alkaline Phosphatase: 77 U/L (ref 39–117)
BUN: 13 mg/dL (ref 6–23)
CO2: 27 mEq/L (ref 19–32)
Calcium: 8.6 mg/dL (ref 8.4–10.5)
Chloride: 91 mEq/L — ABNORMAL LOW (ref 96–112)
Creatinine, Ser: 0.67 mg/dL (ref 0.40–1.20)
GFR: 89.5 mL/min (ref >60.00)
Glucose, Bld: 69 mg/dL — ABNORMAL LOW (ref 70–99)
Potassium: 3.1 mEq/L — ABNORMAL LOW (ref 3.5–5.1)
Sodium: 132 mEq/L — ABNORMAL LOW (ref 135–145)
Total Bilirubin: 1 mg/dL (ref 0.2–1.2)
Total Protein: 6.6 g/dL (ref 6.0–8.3)

## 2020-10-02 LAB — LIPID PANEL
Cholesterol: 107 mg/dL (ref 0–200)
HDL: 27.4 mg/dL — ABNORMAL LOW (ref >39.00)
LDL Cholesterol: 41 mg/dL (ref 0–99)
NonHDL: 79.12
Total CHOL/HDL Ratio: 4
Triglycerides: 189 mg/dL — ABNORMAL HIGH (ref 0.0–149.0)
VLDL: 37.8 mg/dL (ref 0.0–40.0)

## 2020-10-02 LAB — IBC + FERRITIN
Ferritin: 960.9 ng/mL — ABNORMAL HIGH (ref 10.0–291.0)
Iron: 155 ug/dL — ABNORMAL HIGH (ref 42–145)
Saturation Ratios: 85.2 % — ABNORMAL HIGH (ref 20.0–50.0)
TIBC: 182 ug/dL — ABNORMAL LOW (ref 250.0–450.0)
Transferrin: 130 mg/dL — ABNORMAL LOW (ref 212.0–360.0)

## 2020-10-02 LAB — CBC WITH DIFFERENTIAL/PLATELET
Basophils Absolute: 0 10*3/uL (ref 0.0–0.1)
Basophils Relative: 0.8 % (ref 0.0–3.0)
Eosinophils Absolute: 0.1 10*3/uL (ref 0.0–0.7)
Eosinophils Relative: 1 % (ref 0.0–5.0)
HCT: 31 % — ABNORMAL LOW (ref 36.0–46.0)
Hemoglobin: 10.5 g/dL — ABNORMAL LOW (ref 12.0–15.0)
Lymphocytes Relative: 15.6 % (ref 12.0–46.0)
Lymphs Abs: 0.8 10*3/uL (ref 0.7–4.0)
MCHC: 33.8 g/dL (ref 30.0–36.0)
MCV: 85.2 fl (ref 78.0–100.0)
Monocytes Absolute: 0.2 10*3/uL (ref 0.1–1.0)
Monocytes Relative: 4.7 % (ref 3.0–12.0)
Neutro Abs: 4 10*3/uL (ref 1.4–7.7)
Neutrophils Relative %: 77.9 % — ABNORMAL HIGH (ref 43.0–77.0)
Platelets: 78 10*3/uL — ABNORMAL LOW (ref 150.0–400.0)
RBC: 3.64 Mil/uL — ABNORMAL LOW (ref 3.87–5.11)
RDW: 16.8 % — ABNORMAL HIGH (ref 11.5–15.5)
WBC: 5.1 10*3/uL (ref 4.0–10.5)

## 2020-10-02 LAB — MAGNESIUM: Magnesium: 1.8 mg/dL (ref 1.5–2.5)

## 2020-10-02 LAB — PHOSPHORUS: Phosphorus: 3.3 mg/dL (ref 2.3–4.6)

## 2020-10-02 LAB — TSH: TSH: 4.51 u[IU]/mL (ref 0.35–5.50)

## 2020-10-02 MED ORDER — POTASSIUM CHLORIDE CRYS ER 10 MEQ PO TBCR: EXTENDED_RELEASE_TABLET | ORAL | 0 refills | Status: DC

## 2020-10-02 MED ORDER — NYSTATIN 100000 UNIT/ML MT SUSP: 5.0000 mL | Freq: Three times a day (TID) | OROMUCOSAL | 1 refills | Status: AC | PRN

## 2020-10-02 MED ORDER — METOPROLOL SUCCINATE ER 100 MG PO TB24: 50.0000 mg | ORAL_TABLET | Freq: Every day | ORAL | Status: DC

## 2020-10-02 NOTE — Progress Notes (Signed)
Olivia Blanchard DOB: 1950/08/13 Encounter date: 10/02/2020  This is a 70 y.o. female who presents with Chief Complaint  Patient presents with   Hospitalization Follow-up    History of present illness: Olivia Blanchard was assigned to me as PCP when Dr. Inda Merlin retired over 3 years ago, but has not established care. She was seen in ER on 9/20 for feeling weak and fatigued.  She had fallen and hit her head.  (States that head doesn't hurt; but if she pushes on area she fell it is tender) This is following multiple days of nausea, vomiting, diarrhea.  Admitted in ER to decreased appetite for several months as well as significant weight loss over 30 pounds.  She was evaluated by physical therapy during her visit and they recommended home health and physical therapy at discharge.  Hydrochlorothiazide was held due to electrolyte abnormalities and bisoprolol was converted to metoprolol during hospital stay.  Phosphorus level was also low and was replaced during hospitalization.  CT chest abdomen pelvis was ordered due to weight loss:  Distal colonic diverticulosis with wall thickening of the sigmoid  colon and minimal pericolic inflammatory changes suspicious for  diverticulitis.   No evidence of abscess or extraluminal gas/perforation.   Fusiform aneurysmal dilatation of the mid and distal abdominal  aorta, greatest axial dimensions 4.0 x 3.7 cm and 7.6 cm length,  terminating above aortic bifurcation; recommend follow-up every 12  months and vascular consultation. Reference: J Am Coll Radiol  2993;71:696-789.   Wall thickening of the gastric cardia question mass, area of  abnormal soft tissue prominence measuring 3.3 x 4.4 cm, cannot  exclude neoplasm; recommend correlation with upper endoscopy.   Spiculated density RIGHT upper lobe 13 x 8 mm, favor apical scarring  though recommend follow-up in 3 months to assess stability.   Interstitial infiltrates RIGHT upper lobe, RIGHT middle lobe, and   LEFT upper lobe question infection with underlying emphysematous  changes noted.   Tiny BILATERAL pleural effusions greater on RIGHT with compressive  atelectasis of adjacent lower lobes.   Small umbilical hernia containing fat.   Emphysema (ICD10-J43.9).   Aortic Atherosclerosis (ICD10-I70.0).   Aortic aneurysm NOS (ICD10-I71.9).    GI pathogen panel was negative.    Mammogram: has not had mammogram  This last month wasn't eating well. Now she is eating better. Now thinks she has thrush - took bite of egg this morning and mouth felt like it was on fire. She got thrush when son passed - she was drinking a lot of coke then. No vomiting. Was having loose stools prior to going to hospital. After BM then would have watery- gooey which is still happening. No black stools, no blood in stools. Can't eat cheese. Cheese will hurt abdomen. Otherwise not constant pain. Has had more abd discomfort in last month - more lower abdomen.   Has lost 40lbs in last couple of months; no appetite. Appetite is feeling better today. Doesn't feel nauseous.    She was freezing when she was in the hospital. Shaking because she was so cold. She has not had problems with breathing in the past.   She has not had colonoscopy in the past.   Quit smoking when she went into the hospital and hasn't restarted. She was smoking 2 PPD. Sister died from lung cancer.    Allergies  Allergen Reactions   Codeine Phosphate    Lidocaine Palpitations    "stuff the dentist uses to numb your teeth" causes tachycardia.   Prednisone  Palpitations    tachycardia   Current Meds  Medication Sig   famotidine (PEPCID) 20 MG tablet Take 20 mg by mouth daily.   levothyroxine (SYNTHROID) 112 MCG tablet TAKE 1 TABLET BY MOUTH DAILY   metoprolol succinate (TOPROL-XL) 100 MG 24 hr tablet Take 100 mg by mouth daily. Take with or immediately following a meal.    Review of Systems  Constitutional:  Positive for fatigue (feeling much  better today). Negative for chills and fever. Appetite change: appetite has improved. Respiratory:  Negative for cough, chest tightness, shortness of breath and wheezing.   Cardiovascular:  Negative for chest pain, palpitations and leg swelling.  Gastrointestinal:  Positive for abdominal pain (acid reflux + for years; worse in last month). Negative for vomiting. Diarrhea: stools have been loose; no blood or dark stools. Musculoskeletal:  Negative for arthralgias.  Neurological:  Positive for weakness (feels stronger since leaving hospital. has started in home therapy).  Psychiatric/Behavioral:  Positive for sleep disturbance.    Objective:  BP 102/70 (BP Location: Left Arm, Patient Position: Sitting, Cuff Size: Normal)   Pulse 74   Temp 97.6 F (36.4 C) (Oral)   Ht 5\' 3"  (1.6 m)   Wt 149 lb 14.4 oz (68 kg)   SpO2 99%   BMI 26.55 kg/m   Weight: 149 lb 14.4 oz (68 kg)   BP Readings from Last 3 Encounters:  10/02/20 102/70  12/08/19 128/84  12/08/18 130/70   Wt Readings from Last 3 Encounters:  10/02/20 149 lb 14.4 oz (68 kg)  12/08/19 182 lb 9.6 oz (82.8 kg)  12/08/18 176 lb 3.2 oz (79.9 kg)    Physical Exam Constitutional:      General: She is awake.     Appearance: She is ill-appearing.  Eyes:     Comments: proptosis  Cardiovascular:     Rate and Rhythm: Normal rate and regular rhythm.  Pulmonary:     Effort: Pulmonary effort is normal.     Breath sounds: Normal breath sounds. No decreased breath sounds, wheezing, rhonchi or rales.  Abdominal:     General: Abdomen is flat. Bowel sounds are normal.     Palpations: Abdomen is soft.     Tenderness: There is abdominal tenderness.     Comments: Epigastric tenderness, LLQ tenderness to palpation, mild  Musculoskeletal:     Right lower leg: No edema.     Left lower leg: No edema.  Neurological:     Mental Status: She is alert.    Assessment/Plan 1. Low serum phosphorus for age Rechecking levels today; was replaced in  hospital - Phosphorus; Future - Phosphorus  2. Hypomagnesemia Rechecking levels; was low in hospital. - Magnesium; Future - Magnesium  3. Anemia, unspecified type Rechecking levels.no blood loss per patient; should remain stable.  - IBC + Ferritin; Future - IBC + Ferritin  4. Postablative hypothyroidism Continue synthroid; does see endocrinology but with recent hospital visit would like to recheck baseline.  - TSH; Future - TSH  5. Essential hypertension Bp low today. I am going to decrease metoprolol to half of current dose. - CBC with Differential/Platelet; Future - Comprehensive metabolic panel; Future - Comprehensive metabolic panel - CBC with Differential/Platelet  6. Gastric mass Needs to see GI; referral placed urgently and referrals made aware. Discussed this with patient, husband. Really need more information to determine next step. She has never had colonoscopy so discussed consideration for this along with suggested endoscopy. Continue with pepcid. - Ambulatory referral to Gastroenterology  7. Oral thrush Will treat with oral nystatin.   8. Screening for diabetes mellitus Labwork today.  9. Lipid screening  - Lipid panel; Future - Lipid panel  10. Unintentional weight loss Will closely monitor.  - Ambulatory referral to Gastroenterology  11. Pulmonary nodule - needs follow up in 3 months for repeat imaging 12. Aortic enlargement - needs follow up in 1 year for repeat imaging. These orders can be placed at follow up visit as I would like to focus on bloodwork and gastric mass at this point.  Return in about 1 month (around 11/01/2020) for Chronic condition visit.   54 minutes spent in chart review, time with patient reviewing hospital findings and planned follow up, exam, charting.    Micheline Rough, MD

## 2020-10-02 NOTE — Patient Instructions (Signed)
*  decrease the metoprolol to half tablet daily (50mg  daily). Check blood pressures at home a few times/week if you can.

## 2020-10-02 NOTE — Telephone Encounter (Signed)
Transition Care Management Unsuccessful Follow-up Telephone Call  Date of discharge and from where:  [facility] care   Attempts:  3rd Attempt  Reason for unsuccessful TCM follow-up call:  Unable to leave message

## 2020-10-03 ENCOUNTER — Telehealth: Payer: Self-pay | Admitting: Family Medicine

## 2020-10-03 NOTE — Telephone Encounter (Signed)
Patient has question about magic mouthwash (nystatin, lidocaine, diphenhydrAMINE) suspension [[id number]] .Patient has stated it is not working and she is unable to eat. Patient wants to know if there's anything else she can do, or if she can take more than what's instructed on bottle.     Good callback number [phone]   Please Advise

## 2020-10-04 ENCOUNTER — Other Ambulatory Visit: Payer: Self-pay | Admitting: Family Medicine

## 2020-10-04 MED ORDER — FLUCONAZOLE 150 MG PO TABS: 150.0000 mg | ORAL_TABLET | Freq: Once | ORAL | 0 refills | Status: AC

## 2020-10-04 MED ORDER — NYSTATIN 100000 UNIT/ML MT SUSP: 5.0000 mL | Freq: Four times a day (QID) | OROMUCOSAL | 0 refills | Status: DC

## 2020-10-04 NOTE — Telephone Encounter (Signed)
I have sent in a pure nystatin liquid medication for her (she can still use the compounded magic mouthwash for discomfort as needed), but hopefully the nystatin liquid four time daily will be helpful. Use 7 days or at least 48 hours after mouth clears. I have sent in single anti-fungal pill as well for her to take which should also help clear infection. Let me know if not better after weekend.

## 2020-10-04 NOTE — Telephone Encounter (Signed)
Patient informed of the message below.

## 2020-10-16 ENCOUNTER — Ambulatory Visit (HOSPITAL_COMMUNITY): Payer: PPO | Admitting: Hematology

## 2020-10-22 NOTE — Progress Notes (Signed)
[facility] [address] [place] [address]   CLINIC:  Medical Oncology/Hematology  CONSULT NOTE  Patient Care Team: Caren Macadam, MD as PCP - General (Family Medicine) Derek Jack, MD as Medical Oncologist (Medical Oncology) Brien Mates, RN as Oncology Nurse Navigator (Oncology)  CHIEF COMPLAINTS/PURPOSE OF CONSULTATION:  Evaluation of elevated ferritin, anemia, and gastric mass  HISTORY OF PRESENTING ILLNESS:  [name] 70 y.o. female is here because of elevated ferritin, anemia, and gastric mass, at the request of [facility] primary care.  Today she reports feeling fair, and she is accompanied by her husband. She reports fatigue, and she has unintentionally lost 40 pounds in the past 2 months although she reports her appetite is improving. She denies n/v/d, but reports one episode of syncope when standing in her kitchen. She denies double vision but reports slight vision loss. She currently lives at home with her husband, and she is able to do all of her typical home activities. 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 She quit smoking following her visit to the hospital on 09/24/2020; she previously smoked 1 ppd. Her sister had lung cancer.   MEDICAL HISTORY:  Past Medical History:  Diagnosis Date   Grave's disease    HYPERTENSION 09/30/2007   HYPOTHYROIDISM 09/30/2007   Tobacco abuse     SURGICAL HISTORY: Past Surgical History:  Procedure Laterality Date   APPENDECTOMY      SOCIAL HISTORY: Social History   Socioeconomic History   Marital status: Legally Separated    Spouse name: Not on file   Number of children: Not on file   Years of education: Not on file   Highest education level: Not on file  Occupational History   Not on file  Tobacco Use   Smoking status: Former    Packs/day: 1.00    Types: Cigarettes    Quit date: 09/24/2020    Years since quitting: 0.0   Smokeless tobacco: Never  Substance and  Sexual Activity   Alcohol use: Never   Drug use: Never   Sexual activity: Not on file  Other Topics Concern   Not on file  Social History Narrative   Not on file   Social Determinants of Health   Financial Resource Strain: Low Risk    Difficulty of Paying Living Expenses: Not hard at all  Food Insecurity: No Food Insecurity   Worried About Charity fundraiser in the Last Year: Never true   [place] in the Last Year: Never true  Transportation Needs: No Transportation Needs   Lack of Transportation (Medical): No   Lack of Transportation (Non-Medical): No  Physical Activity: Inactive   Days of Exercise per Week: 0 days   Minutes of Exercise per Session: 0 min  Stress: No Stress Concern Present   Feeling of Stress : Not at all  Social Connections: Moderately Isolated   Frequency of Communication with Friends and Family: More than three times a week   Frequency of Social Gatherings with Friends and Family: More than three times a week   Attends Religious Services: Never   Marine scientist or Organizations: No   Attends Music therapist: Never   Marital Status: Married  Human resources officer Violence: Not At Risk   Fear of Current or Ex-Partner: No   Emotionally Abused: No   Physically Abused: No   Sexually Abused: No    FAMILY HISTORY: Family History  Problem  Relation Age of Onset   Diabetes Mother    Thyroid disease Mother    Cancer Neg Hx        sister s- lung Ca, copd    ALLERGIES:  is allergic to codeine phosphate, lidocaine, and prednisone.  MEDICATIONS:  Current Outpatient Medications  Medication Sig Dispense Refill   famotidine (PEPCID) 20 MG tablet Take 20 mg by mouth daily.     levothyroxine (SYNTHROID) 112 MCG tablet TAKE 1 TABLET BY MOUTH DAILY 90 tablet 1   metoprolol succinate (TOPROL-XL) 100 MG 24 hr tablet Take 0.5 tablets (50 mg total) by mouth daily. Take with or immediately following a meal.     nystatin (MYCOSTATIN) 100000  UNIT/ML suspension Take 5 mLs (500,000 Units total) by mouth 4 (four) times daily. 60 mL 0   potassium chloride (KLOR-CON) 10 MEQ tablet Take 1 tablet PO BID x 7 days, then take 1 tablet PO daily 90 tablet 0   No current facility-administered medications for this visit.    REVIEW OF SYSTEMS:   Review of Systems  Constitutional:  Positive for appetite change (35% improved), fatigue (depleted) and unexpected weight change (-40 lbs).  Gastrointestinal:  Positive for abdominal pain. Negative for diarrhea, nausea and vomiting.  Psychiatric/Behavioral:  Positive for depression and sleep disturbance. The patient is nervous/anxious.   All other systems reviewed and are negative.   PHYSICAL EXAMINATION: ECOG PERFORMANCE STATUS: 1 - Symptomatic but completely ambulatory  Vitals:   10/23/20 1302  BP: 120/60  Pulse: 75  Resp: 18  Temp: 97.6 F (36.4 C)  SpO2: 100%   Filed Weights   10/23/20 1302  Weight: 139 lb 3.2 oz (63.1 kg)   Physical Exam Vitals reviewed.  Constitutional:      Appearance: Normal appearance.  Cardiovascular:     Rate and Rhythm: Normal rate and regular rhythm.     Pulses: Normal pulses.     Heart sounds: Normal heart sounds.  Pulmonary:     Effort: Pulmonary effort is normal.     Breath sounds: Normal breath sounds.  Chest:  Breasts:    Right: Normal. No inverted nipple, mass, nipple discharge or tenderness.     Left: Normal. No inverted nipple, mass, nipple discharge or tenderness.  Abdominal:     Palpations: Abdomen is soft. There is no hepatomegaly, splenomegaly or mass.     Tenderness: There is no abdominal tenderness.  Musculoskeletal:     Right lower leg: No edema.     Left lower leg: No edema.  Lymphadenopathy:     Upper Body:     Right upper body: No supraclavicular, axillary or pectoral adenopathy.     Left upper body: No supraclavicular, axillary or pectoral adenopathy.     Lower Body: No right inguinal adenopathy. No left inguinal adenopathy.   Neurological:     General: No focal deficit present.     Mental Status: She is alert and oriented to person, place, and time.  Psychiatric:        Mood and Affect: Mood normal.        Behavior: Behavior normal.     LABORATORY DATA:  I have reviewed the data as listed CBC Latest Ref Rng & Units 10/02/2020 02/25/2010  WBC 4.0 - 10.5 K/uL 5.1 8.4  Hemoglobin 12.0 - 15.0 g/dL 10.5(L) 14.8  Hematocrit 36.0 - 46.0 % 31.0(L) 44.8  Platelets 150.0 - 400.0 K/uL 78.0(L) 175   CMP Latest Ref Rng & Units 10/02/2020 12/08/2019 12/08/2018  Glucose 70 -  99 mg/dL 69(L) 79 85  BUN 6 - 23 mg/dL 13 10 10   Creatinine 0.40 - 1.20 mg/dL 0.67 0.93 0.99  Sodium 135 - 145 mEq/L 132(L) 142 140  Potassium 3.5 - 5.1 mEq/L 3.1(L) 3.8 3.3(L)  Chloride 96 - 112 mEq/L 91(L) 103 99  CO2 19 - 32 mEq/L 27 31 31   Calcium 8.4 - 10.5 mg/dL 8.6 9.2 9.9  Total Protein 6.0 - 8.3 g/dL 6.6 7.0 7.8  Total Bilirubin 0.2 - 1.2 mg/dL 1.0 0.3 0.5  Alkaline Phos 39 - 117 U/L 77 91 113  AST 0 - 37 U/L 19 20 16   ALT 0 - 35 U/L 13 12 12     RADIOGRAPHIC STUDIES: I have personally reviewed the radiological images as listed and agreed with the findings in the report. No results found.  ASSESSMENT:  Weight loss and gastric mass: - Recent hospitalization at [facility] on 09/24/2020 for syncope with no loss of consciousness. - Weight loss of 40 pounds in the last 2 to 3 months.  Decreased appetite and severe fatigue. - CT CAP on 09/26/2020 showed distal colonic diverticulosis with wall thickening of the sigmoid colon.  Fusiform aneurysmal dilatation of the mid and distal abdominal aorta, 4 x 3.7 x 7.6 cm length.  Wall thickening of the gastric cardia question mass, area of soft tissue prominence measuring 3.3 x 4.4 cm, cannot exclude neoplasm.  Spiculated density in the right upper lobe 13 x 8 mm favor apical scarring.  Interstitial infiltrates in the right upper lobe, right middle lobe and left upper lobe.  Tiny bilateral pleural  effusions. - Never had colonoscopy or mammograms.  Social/family history: - She lives at home with her husband.  She worked at a Social worker company prior to retirement.  She quit smoking on 09/24/2020.  She smoked 2 packs/day for 53 years. - Sister died of lung cancer.   PLAN:  Weight loss and gastric mass: - Reviewed scan results with the patient in detail. - Recommend PET CT scan and CEA level. - Recommend GI evaluation urgently for endoscopy and biopsy. - RTC after PET scan.  Elevated ferritin level: - Labs on 10/02/2020 showed ferritin 960, percent saturation 85. - We will evaluate for hemochromatosis.  No family history of hemochromatosis.  Normocytic anemia: - We will check for X21, folic acid, methylmalonic acid and copper levels.  We will also check SPEP.   All questions were answered. The patient knows to call the clinic with any problems, questions or concerns.   Derek Jack, MD, 10/23/20 1:45 PM  [place] [phone]   I, Thana Ates, am acting as a scribe for Dr. Derek Jack.  I, Derek Jack MD, have reviewed the above documentation for accuracy and completeness, and I agree with the above.

## 2020-10-23 ENCOUNTER — Inpatient Hospital Stay (HOSPITAL_COMMUNITY): Payer: PPO

## 2020-10-23 ENCOUNTER — Other Ambulatory Visit: Payer: Self-pay

## 2020-10-23 ENCOUNTER — Encounter (HOSPITAL_COMMUNITY): Payer: Self-pay | Admitting: Hematology

## 2020-10-23 ENCOUNTER — Telehealth: Payer: Self-pay | Admitting: Family Medicine

## 2020-10-23 ENCOUNTER — Inpatient Hospital Stay (HOSPITAL_COMMUNITY): Payer: PPO | Attending: Hematology | Admitting: Hematology

## 2020-10-23 VITALS — BP 120/60 | HR 75 | Temp 97.6°F | Resp 18 | Ht 62.0 in | Wt 139.2 lb

## 2020-10-23 DIAGNOSIS — I10 Essential (primary) hypertension: Secondary | ICD-10-CM | POA: Diagnosis not present

## 2020-10-23 DIAGNOSIS — Z87891 Personal history of nicotine dependence: Secondary | ICD-10-CM

## 2020-10-23 DIAGNOSIS — E039 Hypothyroidism, unspecified: Secondary | ICD-10-CM

## 2020-10-23 DIAGNOSIS — R634 Abnormal weight loss: Secondary | ICD-10-CM | POA: Insufficient documentation

## 2020-10-23 DIAGNOSIS — R1906 Epigastric swelling, mass or lump: Secondary | ICD-10-CM

## 2020-10-23 DIAGNOSIS — Z801 Family history of malignant neoplasm of trachea, bronchus and lung: Secondary | ICD-10-CM | POA: Diagnosis not present

## 2020-10-23 DIAGNOSIS — Z79899 Other long term (current) drug therapy: Secondary | ICD-10-CM | POA: Diagnosis not present

## 2020-10-23 DIAGNOSIS — R7989 Other specified abnormal findings of blood chemistry: Secondary | ICD-10-CM

## 2020-10-23 DIAGNOSIS — E538 Deficiency of other specified B group vitamins: Secondary | ICD-10-CM

## 2020-10-23 DIAGNOSIS — K3189 Other diseases of stomach and duodenum: Secondary | ICD-10-CM | POA: Insufficient documentation

## 2020-10-23 DIAGNOSIS — D649 Anemia, unspecified: Secondary | ICD-10-CM

## 2020-10-23 LAB — FOLATE: Folate: 4.3 ng/mL — ABNORMAL LOW (ref >5.9)

## 2020-10-23 LAB — VITAMIN B12: Vitamin B-12: 695 pg/mL (ref 180–914)

## 2020-10-23 MED ORDER — METOPROLOL SUCCINATE ER 50 MG PO TB24: 50.0000 mg | ORAL_TABLET | Freq: Every day | ORAL | 0 refills | Status: DC

## 2020-10-23 MED ORDER — FOLIC ACID 1 MG PO TABS: 1.0000 mg | ORAL_TABLET | Freq: Every day | ORAL | 6 refills | Status: DC

## 2020-10-23 NOTE — Telephone Encounter (Addendum)
Olivia Blanchard with [facility] is calling and has questions concerning metoprolol  directions [place], [place] Phone:  [phone]  Fax:  [phone]

## 2020-10-23 NOTE — Patient Instructions (Addendum)
[place] at [facility] Discharge Instructions  You were seen and examined today by Dr. Delton Coombes. Dr. Delton Coombes is a medical oncologist, meaning that he specializes in the management of cancer diagnoses. Dr. Delton Coombes discussed your past medical history, family history of cancers, and the events that led to you being here today.  You were referred to Dr. Delton Coombes due to a mass noted in your stomach as well as elevated ferritin (iron storage). Dr. Delton Coombes has recommended additional blood work to identify the cause of that elevated ferritin. Dr. Delton Coombes has also recommended a PET scan. A PET scan is a specialized CT scan that illuminates where there is cancer present in your body.  You should also see a gastroenterologist to get an EGD to identify what exactly the mass in your stomach is.  Follow-up as scheduled.   Thank you for choosing [place] at [facility] to provide your oncology and hematology care.  To afford each patient quality time with our provider, please arrive at least 15 minutes before your scheduled appointment time.   If you have a lab appointment with the [facility] please come in thru the Main Entrance and check in at the main information desk.  You need to re-schedule your appointment should you arrive 10 or more minutes late.  We strive to give you quality time with our providers, and arriving late affects you and other patients whose appointments are after yours.  Also, if you no show three or more times for appointments you may be dismissed from the clinic at the providers discretion.     Again, thank you for choosing [facility].  Our hope is that these requests will decrease the amount of time that you wait before being seen by our physicians.       _____________________________________________________________  Should you have questions after your visit to [facility], please contact  our office at [phone] and follow the prompts.  Our office hours are 8:00 a.m. and 4:30 p.m. Monday - Friday.  Please note that voicemails left after 4:00 p.m. may not be returned until the following business day.  We are closed weekends and major holidays.  You do have access to a nurse 24-7, just call the main number to the clinic [phone] and do not press any options, hold on the line and a nurse will answer the phone.    For prescription refill requests, have your pharmacy contact our office and allow 72 hours.    Due to Covid, you will need to wear a mask upon entering the hospital. If you do not have a mask, a mask will be given to you at the Main Entrance upon arrival. For doctor visits, patients may have 1 support person age 24 or older with them. For treatment visits, patients can not have anyone with them due to social distancing guidelines and our immunocompromised population.

## 2020-10-23 NOTE — Telephone Encounter (Signed)
Spoke with Judson Roch, the pharmacist and she stated the patient bought a bottle in to the pharmacy and someone crossed out instructions on the bottle and it was written on the bottle to take 0.5 tablet daily and she wanted to clarify as the patient had an Rx from the hospital for 100mg  daily.  Sarah requested a new Rx be sent with corrected instructions via escribe.  Message sent to PCP.

## 2020-10-23 NOTE — Telephone Encounter (Signed)
Rx done. 

## 2020-10-23 NOTE — Telephone Encounter (Signed)
Correct dose should be metoprolol succinate 50mg  daily. Ok to send new rx.

## 2020-10-24 ENCOUNTER — Telehealth (HOSPITAL_COMMUNITY): Payer: Self-pay | Admitting: *Deleted

## 2020-10-24 LAB — COPPER, SERUM: Copper: 147 ug/dL (ref 80–158)

## 2020-10-24 LAB — CEA: CEA: 6.3 ng/mL — ABNORMAL HIGH (ref 0.0–4.7)

## 2020-10-24 NOTE — Telephone Encounter (Signed)
Per Dr Delton Coombes, patient called and notified that her folic acid was low and a script was called to [facility] 1 mg daily.  Verbalized understanding.

## 2020-10-25 ENCOUNTER — Other Ambulatory Visit (HOSPITAL_COMMUNITY): Payer: Self-pay

## 2020-10-25 ENCOUNTER — Encounter (HOSPITAL_COMMUNITY): Payer: Self-pay

## 2020-10-25 LAB — PROTEIN ELECTROPHORESIS, SERUM
A/G Ratio: 0.8 (ref 0.7–1.7)
Albumin ELP: 3 g/dL (ref 2.9–4.4)
Alpha-1-Globulin: 0.3 g/dL (ref 0.0–0.4)
Alpha-2-Globulin: 0.6 g/dL (ref 0.4–1.0)
Beta Globulin: 1 g/dL (ref 0.7–1.3)
Gamma Globulin: 1.8 g/dL (ref 0.4–1.8)
Globulin, Total: 3.7 g/dL (ref 2.2–3.9)
Total Protein ELP: 6.7 g/dL (ref 6.0–8.5)

## 2020-10-25 MED ORDER — ALPRAZOLAM 0.25 MG PO TABS: 0.2500 mg | ORAL_TABLET | Freq: Two times a day (BID) | ORAL | 0 refills | Status: DC | PRN

## 2020-10-25 NOTE — Progress Notes (Signed)
Patient called today reporting ongoing anxiety, states that she is not resting well and her mind is constantly going. Dr. Delton Coombes aware and order received for Xanax 0.25mg  BID PRN. Patient made aware and appreciative of my call.

## 2020-10-28 LAB — METHYLMALONIC ACID, SERUM: Methylmalonic Acid, Quantitative: 117 nmol/L (ref 0–378)

## 2020-10-30 ENCOUNTER — Other Ambulatory Visit: Payer: Self-pay

## 2020-10-30 ENCOUNTER — Ambulatory Visit (INDEPENDENT_AMBULATORY_CARE_PROVIDER_SITE_OTHER): Payer: PPO | Admitting: Gastroenterology

## 2020-10-30 ENCOUNTER — Encounter (INDEPENDENT_AMBULATORY_CARE_PROVIDER_SITE_OTHER): Payer: Self-pay | Admitting: Gastroenterology

## 2020-10-30 ENCOUNTER — Encounter (INDEPENDENT_AMBULATORY_CARE_PROVIDER_SITE_OTHER): Payer: Self-pay

## 2020-10-30 ENCOUNTER — Telehealth (INDEPENDENT_AMBULATORY_CARE_PROVIDER_SITE_OTHER): Payer: Self-pay

## 2020-10-30 VITALS — BP 108/64 | HR 86 | Temp 97.3°F | Ht 62.0 in | Wt 139.1 lb

## 2020-10-30 DIAGNOSIS — K639 Disease of intestine, unspecified: Secondary | ICD-10-CM | POA: Diagnosis not present

## 2020-10-30 DIAGNOSIS — K3189 Other diseases of stomach and duodenum: Secondary | ICD-10-CM

## 2020-10-30 DIAGNOSIS — I714 Abdominal aortic aneurysm, without rupture, unspecified: Secondary | ICD-10-CM

## 2020-10-30 MED ORDER — PEG 3350-KCL-NA BICARB-NACL 420 G PO SOLR: 4000.0000 mL | ORAL | 0 refills | Status: DC

## 2020-10-30 NOTE — Telephone Encounter (Signed)
Olivia Blanchard, CMA  

## 2020-10-30 NOTE — Patient Instructions (Signed)
Schedule EGD and colonoscopy Follow up with primary care physician regarding aortic abdominal aneurysm

## 2020-10-30 NOTE — Progress Notes (Signed)
Maylon Peppers, M.D. Gastroenterology & Hepatology [facility] [address] [place], [place] [address] Primary Care Physician: Caren Macadam, MD [place] [address]  Referring MD: Derek Jack, MD  Chief Complaint: Gastric mass and sigmoid thickening  History of Present Illness: Olivia Blanchard is a 70 y.o. female with PMH Grave's disease s/p radiation therapy now hypothyroid, who presents for evaluation of gastric mass and sigmoid thickening.  Patient comes to the office with her husband, patient is a poor historian.  Patient reports that 2 months ago she passed out at her home and was taken by EMS to the ER at [facility] for further evaluation.  She reported feeling unsteady a few days before her symptoms started.  2 months before going to the ED she noticed poor appetite and decreased will to have a food intake, but she denied having any early satiety. She lost 40 lb due to this. She reports that she felt her appetite improved after she went to [facility]. Has not noticed any more weight loss since then as her weight has been stable but has not been able to gain back any more pounds. Also noticed some watery diarrhea since her symptoms of poor appetite started. Noticed "whitish secretion in her buttocks" when wiping after she urinates or defecates, which she describes like "gooey". More recently her stool has been performed. She reported having decreased appetite and poor satisfaction with food for the last month.  Reports that she has had an intermittent pain in her LLQ for the last month but is unclear what type of pain.  It does not radiate anywhere else.   The patient denies having any nausea, vomiting, fever, chills, hematochezia, melena, hematemesis, jaundice, pruritus.  Upon  evaluation at [facility] on 09/24/2020, the patient was found to have major derangements in her electrolytes  with sodium of 126, potassium of 2.2, chloride of 75, normal renal function and liver enzymes, CBC showed leukocytosis of 11,000 with normal hemoglobin of 13.4 and platelet count of 167.  She was hydrated with repeat hemoglobin of 9.3 and MCV of 86, platelets was 112 and will also count was 10.1 on 09/26/2020.  She also had negative GI pathogen panel, ova and parasites in stool.  Her electrolytes were corrected with repeat sodium of 132 and potassium of 3.9.  Notably she had hypophosphatemia 0.9 which was repleted orally with repeat value of 3.6, magnesium was low 1.2.  The patient also underwent a CT of the chest, abdomen and pelvis without contrast which showed: Distal colonic diverticulosis with wall thickening of the sigmoid  colon and minimal pericolic inflammatory changes suspicious for  diverticulitis.   No evidence of abscess or extraluminal gas/perforation.   Fusiform aneurysmal dilatation of the mid and distal abdominal  aorta, greatest axial dimensions 4.0 x 3.7 cm and 7.6 cm length,  terminating above aortic bifurcation; recommend follow-up every 12 months and vascular consultation.  Wall thickening of the gastric cardia question mass, area of abnormal soft tissue prominence measuring 3.3 x 4.4 cm, cannot exclude neoplasm; recommend correlation with upper endoscopy.   Spiculated density RIGHT upper lobe 13 x 8 mm, favor apical scarring though recommend follow-up in 3 months to assess stability.   Interstitial infiltrates RIGHT upper lobe, RIGHT middle lobe, and LEFT upper lobe question infection with underlying emphysematous changes noted.   Tiny BILATERAL pleural effusions greater on RIGHT with compressive atelectasis of adjacent lower lobes.   Small  umbilical hernia containing fat.   Due to these findings, the patient was referred to oncology for further evaluation.  She was subsequently referred to our clinic for further investigation with EGD and colonoscopy.  Most recent labs.  CEA on 10/23/2020 was elevated 6.3.  Last LGX:QJJHE Last Colonoscopy:never  FHx: neg for any gastrointestinal/liver disease, lung cancer Social: quit smoking in 09/2020 (smoked for 51 years), neg alcohol or illicit drug use Surgical: appendectomy  Past Medical History: Past Medical History:  Diagnosis Date   Grave's disease    HYPERTENSION 09/30/2007   HYPOTHYROIDISM 09/30/2007   Tobacco abuse     Past Surgical History: Past Surgical History:  Procedure Laterality Date   APPENDECTOMY      Family History: Family History  Problem Relation Age of Onset   Diabetes Mother    Thyroid disease Mother    Cancer Neg Hx        sister s- lung Ca, copd    Social History: Social History   Tobacco Use  Smoking Status Former   Packs/day: 1.00   Types: Cigarettes   Quit date: 09/24/2020   Years since quitting: 0.0  Smokeless Tobacco Never   Social History   Substance and Sexual Activity  Alcohol Use Never   Social History   Substance and Sexual Activity  Drug Use Never    Allergies: Allergies  Allergen Reactions   Codeine Phosphate    Lidocaine Palpitations    "stuff the dentist uses to numb your teeth" causes tachycardia.   Prednisone Palpitations    tachycardia    Medications: Current Outpatient Medications  Medication Sig Dispense Refill   ALPRAZolam (XANAX) 0.25 MG tablet Take 1 tablet (0.25 mg total) by mouth 2 (two) times daily as needed for anxiety. 30 tablet 0   famotidine (PEPCID) 20 MG tablet Take 20 mg by mouth daily.     folic acid (FOLVITE) 1 MG tablet Take 1 tablet (1 mg total) by mouth daily. 30 tablet 6   levothyroxine (SYNTHROID) 112 MCG tablet TAKE 1 TABLET BY MOUTH DAILY 90 tablet 1   metoprolol succinate (TOPROL-XL) 50 MG 24 hr tablet Take 1 tablet (50 mg total) by mouth daily. 90 tablet 0   potassium chloride (KLOR-CON) 10 MEQ tablet Take 1 tablet PO BID x 7 days, then take 1 tablet PO daily 90 tablet 0   No current facility-administered  medications for this visit.    Review of Systems: GENERAL: negative for malaise, night sweats HEENT: No changes in hearing or vision, no nose bleeds or other nasal problems. NECK: Negative for lumps, goiter, pain and significant neck swelling RESPIRATORY: Negative for cough, wheezing CARDIOVASCULAR: Negative for chest pain, leg swelling, palpitations, orthopnea GI: SEE HPI MUSCULOSKELETAL: Negative for joint pain or swelling, back pain, and muscle pain. SKIN: Negative for lesions, rash PSYCH: Negative for sleep disturbance, mood disorder and recent psychosocial stressors. HEMATOLOGY Negative for prolonged bleeding, bruising easily, and swollen nodes. ENDOCRINE: Negative for cold or heat intolerance, polyuria, polydipsia and goiter. NEURO: negative for tremor, gait imbalance, syncope and seizures. The remainder of the review of systems is noncontributory.   Physical Exam: BP 108/64 (BP Location: Right Arm, Patient Position: Sitting, Cuff Size: Normal)   Pulse 86   Temp (!) 97.3 F (36.3 C) (Oral)   Ht 5\' 2"  (1.575 m)   Wt 139 lb 1.6 oz (63.1 kg)   BMI 25.44 kg/m  GENERAL: The patient is AO x3, in no acute distress. Sitting in wheelchair. Has temporal wasting.  HEENT: Head is normocephalic and atraumatic. EOMI are intact. Mouth is well hydrated and without lesions. NECK: Supple. No masses LUNGS: Clear to auscultation. No presence of rhonchi/wheezing/rales. Adequate chest expansion HEART: RRR, normal s1 and s2. ABDOMEN: Soft, nontender, no guarding, no peritoneal signs, and nondistended. BS +. No masses. EXTREMITIES: Without any cyanosis, clubbing, rash, lesions or edema. NEUROLOGIC: AOx3, no focal motor deficit. SKIN: no jaundice, no rashes   Imaging/Labs: as above  I personally reviewed and interpreted the available labs, imaging and endoscopic files.  Impression and Plan: Olivia Blanchard is a 70 y.o. female with PMH Grave's disease s/p radiation therapy now hypothyroid,  who presents for evaluation of gastric mass and sigmoid thickening.  The patient has presented significant weight loss with unexplained changes in her appetite but with concerning findings on recent CT scan performed at outside facility.  She had presence of a possible gastric mass and sigmoid thickening, with recently elevated CEA which raises concern of possible gastric malignancy versus sigmoid malignancy.  I had a thorough discussion with the patient and her husband for close to 45 minutes regarding the need to have an evaluation with an EGD and colonoscopy to explore this further, as well as the need to proceed with the PET scan scheduled for tomorrow to evaluate her symptoms/findings further.  The patient understood and agreed.  She was very tearful due to her family history of lung cancer but is willing to proceed with the testing to determine her the next steps in her management.  I advised the patient to follow-up with her PCP regarding her AAA with consideration for possible vascular surgery management as outpatient based on discussion with primary care physician.  - Schedule EGD and colonoscopy - Follow up with primary care physician regarding aortic abdominal aneurysm  All questions were answered.      Maylon Peppers, MD Gastroenterology and Hepatology [facility]

## 2020-10-31 ENCOUNTER — Encounter (HOSPITAL_COMMUNITY): Admission: RE | Admit: 2020-10-31 | Payer: PPO | Source: Ambulatory Visit

## 2020-10-31 LAB — HEMOCHROMATOSIS DNA-PCR(C282Y,H63D)

## 2020-11-01 ENCOUNTER — Encounter (INDEPENDENT_AMBULATORY_CARE_PROVIDER_SITE_OTHER): Payer: Self-pay

## 2020-11-01 ENCOUNTER — Telehealth (HOSPITAL_COMMUNITY): Payer: Self-pay | Admitting: *Deleted

## 2020-11-01 NOTE — Telephone Encounter (Signed)
Called patent to follow up on after hours call on 10/27.  Patient states that she is no longer having vomiting and urine has returned to normal.  Denies any complaints at this time.

## 2020-11-04 NOTE — Progress Notes (Signed)
Olivia Blanchard,  [id number]   CLINIC:  Medical Oncology/Hematology  PCP:  Caren Macadam, MD [place] / [place] [address]  [phone]  REASON FOR VISIT:  Follow-up for elevated ferritin, anemia, and gastric mass  PRIOR THERAPY: none  CURRENT THERAPY: under work-up  INTERVAL HISTORY:  Olivia Blanchard, a 70 y.o. female, returns for routine follow-up for her elevated ferritin, anemia, and gastric mass. Olivia Blanchard was last seen on 10/23/2020.  Today she reports feeling poorly. She reports nausea, dry heaving, and vomiting. She is not drinking Boost or Ensure. She is currently taking 1 Xanax at night.   REVIEW OF SYSTEMS:  Review of Systems  Constitutional:  Positive for appetite change (no appetite) and fatigue (30%).  Respiratory:  Positive for shortness of breath (with exertion).   Gastrointestinal:  Positive for nausea and vomiting.  All other systems reviewed and are negative.  PAST MEDICAL/SURGICAL HISTORY:  Past Medical History:  Diagnosis Date   Grave's disease    HYPERTENSION 09/30/2007   HYPOTHYROIDISM 09/30/2007   Tobacco abuse    Past Surgical History:  Procedure Laterality Date   APPENDECTOMY      SOCIAL HISTORY:  Social History   Socioeconomic History   Marital status: Legally Separated    Spouse name: Not on file   Number of children: Not on file   Years of education: Not on file   Highest education level: Not on file  Occupational History   Not on file  Tobacco Use   Smoking status: Former    Packs/day: 1.00    Types: Cigarettes    Quit date: 09/24/2020    Years since quitting: 0.1   Smokeless tobacco: Never  Substance and Sexual Activity   Alcohol use: Never   Drug use: Never   Sexual activity: Not on file  Other Topics Concern   Not on file  Social History Narrative   Not on file   Social Determinants of Health   Financial Resource Strain: Low Risk    Difficulty of  Paying Living Expenses: Not hard at all  Food Insecurity: No Food Insecurity   Worried About Charity fundraiser in the Last Year: Never true   Ran Out of Food in the Last Year: Never true  Transportation Needs: No Transportation Needs   Lack of Transportation (Medical): No   Lack of Transportation (Non-Medical): No  Physical Activity: Inactive   Days of Exercise per Week: 0 days   Minutes of Exercise per Session: 0 min  Stress: No Stress Concern Present   Feeling of Stress : Not at all  Social Connections: Moderately Isolated   Frequency of Communication with Friends and Family: More than three times a week   Frequency of Social Gatherings with Friends and Family: More than three times a week   Attends Religious Services: Never   Marine scientist or Organizations: No   Attends Music therapist: Never   Marital Status: Married  Human resources officer Violence: Not At Risk   Fear of Current or Ex-Partner: No   Emotionally Abused: No   Physically Abused: No   Sexually Abused: No    FAMILY HISTORY:  Family History  Problem Relation Age of Onset   Diabetes Mother    Thyroid disease Mother    Cancer Neg Hx        sister s- lung Ca, copd    CURRENT MEDICATIONS:  Current Outpatient Medications  Medication Sig Dispense Refill   ALPRAZolam (XANAX) 0.25 MG tablet Take 1 tablet (0.25 mg total) by mouth 2 (two) times daily as needed for anxiety. 30 tablet 0   famotidine (PEPCID) 20 MG tablet Take 20 mg by mouth daily.     folic acid (FOLVITE) 1 MG tablet Take 1 tablet (1 mg total) by mouth daily. 30 tablet 6   levothyroxine (SYNTHROID) 112 MCG tablet TAKE 1 TABLET BY MOUTH DAILY 90 tablet 1   metoprolol succinate (TOPROL-XL) 50 MG 24 hr tablet Take 1 tablet (50 mg total) by mouth daily. 90 tablet 0   polyethylene glycol-electrolytes (TRILYTE) 420 g solution Take 4,000 mLs by mouth as directed. 4000 mL 0   potassium chloride (KLOR-CON) 10 MEQ tablet Take 1 tablet PO BID x  7 days, then take 1 tablet PO daily 90 tablet 0   No current facility-administered medications for this visit.    ALLERGIES:  Allergies  Allergen Reactions   Codeine Phosphate    Lidocaine Palpitations    "stuff the dentist uses to numb your teeth" causes tachycardia.   Prednisone Palpitations    tachycardia    PHYSICAL EXAM:  Performance status (ECOG): 1 - Symptomatic but completely ambulatory  There were no vitals filed for this visit. Wt Readings from Last 3 Encounters:  10/30/20 139 lb 1.6 oz (63.1 kg)  10/23/20 139 lb 3.2 oz (63.1 kg)  10/02/20 149 lb 14.4 oz (68 kg)   Physical Exam Vitals reviewed.  Constitutional:      Appearance: Normal appearance.     Comments: In wheelchair  Cardiovascular:     Rate and Rhythm: Normal rate and regular rhythm.     Pulses: Normal pulses.     Heart sounds: Normal heart sounds.  Pulmonary:     Effort: Pulmonary effort is normal.     Breath sounds: Normal breath sounds.  Neurological:     General: No focal deficit present.     Mental Status: She is alert and oriented to person, place, and time.  Psychiatric:        Mood and Affect: Mood normal.        Behavior: Behavior normal.    LABORATORY DATA:  I have reviewed the labs as listed.  CBC Latest Ref Rng & Units 10/02/2020 02/25/2010  WBC 4.0 - 10.5 K/uL 5.1 8.4  Hemoglobin 12.0 - 15.0 g/dL 10.5(L) 14.8  Hematocrit 36.0 - 46.0 % 31.0(L) 44.8  Platelets 150.0 - 400.0 K/uL 78.0(L) 175   CMP Latest Ref Rng & Units 10/02/2020 12/08/2019 12/08/2018  Glucose 70 - 99 mg/dL 69(L) 79 85  BUN 6 - 23 mg/dL 13 10 10   Creatinine 0.40 - 1.20 mg/dL 0.67 0.93 0.99  Sodium 135 - 145 mEq/L 132(L) 142 140  Potassium 3.5 - 5.1 mEq/L 3.1(L) 3.8 3.3(L)  Chloride 96 - 112 mEq/L 91(L) 103 99  CO2 19 - 32 mEq/L 27 31 31   Calcium 8.4 - 10.5 mg/dL 8.6 9.2 9.9  Total Protein 6.0 - 8.3 g/dL 6.6 7.0 7.8  Total Bilirubin 0.2 - 1.2 mg/dL 1.0 0.3 0.5  Alkaline Phos 39 - 117 U/L 77 91 113  AST 0 - 37 U/L  19 20 16   ALT 0 - 35 U/L 13 12 12       Component Value Date/Time   RBC 3.64 (L) 10/02/2020 1136   MCV 85.2 10/02/2020 1136   MCH 30.0 02/25/2010 0948   MCHC 33.8 10/02/2020 1136   RDW 16.8 (H) 10/02/2020 1136  LYMPHSABS 0.8 10/02/2020 1136   MONOABS 0.2 10/02/2020 1136   EOSABS 0.1 10/02/2020 1136   BASOSABS 0.0 10/02/2020 1136    DIAGNOSTIC IMAGING:  I have independently reviewed the scans and discussed with the patient. No results found.   ASSESSMENT:  Weight loss and gastric mass: - Recent hospitalization at [facility] on 09/24/2020 for syncope with no loss of consciousness. - Weight loss of 40 pounds in the last 2 to 3 months.  Decreased appetite and severe fatigue. - CT CAP on 09/26/2020 showed distal colonic diverticulosis with wall thickening of the sigmoid colon.  Fusiform aneurysmal dilatation of the mid and distal abdominal aorta, 4 x 3.7 x 7.6 cm length.  Wall thickening of the gastric cardia question mass, area of soft tissue prominence measuring 3.3 x 4.4 cm, cannot exclude neoplasm.  Spiculated density in the right upper lobe 13 x 8 mm favor apical scarring.  Interstitial infiltrates in the right upper lobe, right middle lobe and left upper lobe.  Tiny bilateral pleural effusions. - Never had colonoscopy or mammograms.   Social/family history: - She lives at home with her husband.  She worked at a Social worker company prior to retirement.  She quit smoking on 09/24/2020.  She smoked 2 packs/day for 53 years. - Sister died of lung cancer.   PLAN:  Weight loss and gastric mass: - She has reportedly canceled her PET scan last week. - She is concerned about claustrophobia. - I have explained and reassured her that it is similar to CT scan which she already had without any problems. - We will give her Xanax 0.25 mg to be taken an hour before the scan and another pill to be taken if needed during wait time after contrast. - She was told to have EGD with Dr.  Jenetta Downer on 11/12/2020. - I have discussed with her importance of having PET scan for staging and EGD for diagnosis for possible malignancy. - CEA level on 10/23/2020 was elevated at 6.3. - I would follow-up with her on 11/18/2020 to discuss results.   Elevated ferritin level: - Labs on 10/02/2020 showed ferritin 960, percent saturation 85. - Hereditary hemochromatosis testing for all 3 mutations was negative.   Normocytic anemia: - We have reviewed labs from 10/23/2020.  Folic acid was low at 4.3.  B12, methylmalonic acid and copper levels were normal.  SPEP is negative. - We have started her on folic acid 1 mg tablet daily.  4.  Thrombocytopenia: - Platelet count was 78 on 10/02/2020.  Prior to that her platelet count was normal. - No mention of enlarged spleen on CT scan done at outside hospital. - We will review spleen size on the PET scan.  We will also check her platelet count after folic acid is repleted.  Orders placed this encounter:  No orders of the defined types were placed in this encounter.    Derek Jack, MD [place] [phone]   I, Thana Ates, am acting as a scribe for Dr. Derek Jack.  I, Derek Jack MD, have reviewed the above documentation for accuracy and completeness, and I agree with the above.

## 2020-11-05 ENCOUNTER — Inpatient Hospital Stay (HOSPITAL_COMMUNITY): Payer: PPO | Attending: Hematology | Admitting: Hematology

## 2020-11-05 ENCOUNTER — Other Ambulatory Visit: Payer: Self-pay

## 2020-11-05 VITALS — BP 128/74 | HR 102 | Temp 98.5°F | Resp 18 | Wt 138.7 lb

## 2020-11-05 DIAGNOSIS — E538 Deficiency of other specified B group vitamins: Secondary | ICD-10-CM | POA: Diagnosis not present

## 2020-11-05 DIAGNOSIS — Z79899 Other long term (current) drug therapy: Secondary | ICD-10-CM | POA: Insufficient documentation

## 2020-11-05 DIAGNOSIS — D649 Anemia, unspecified: Secondary | ICD-10-CM | POA: Insufficient documentation

## 2020-11-05 DIAGNOSIS — D696 Thrombocytopenia, unspecified: Secondary | ICD-10-CM | POA: Insufficient documentation

## 2020-11-05 DIAGNOSIS — Z87891 Personal history of nicotine dependence: Secondary | ICD-10-CM | POA: Insufficient documentation

## 2020-11-05 DIAGNOSIS — I10 Essential (primary) hypertension: Secondary | ICD-10-CM | POA: Diagnosis not present

## 2020-11-05 DIAGNOSIS — Z23 Encounter for immunization: Secondary | ICD-10-CM | POA: Diagnosis not present

## 2020-11-05 DIAGNOSIS — R7989 Other specified abnormal findings of blood chemistry: Secondary | ICD-10-CM | POA: Insufficient documentation

## 2020-11-05 DIAGNOSIS — E039 Hypothyroidism, unspecified: Secondary | ICD-10-CM | POA: Diagnosis not present

## 2020-11-05 DIAGNOSIS — K3189 Other diseases of stomach and duodenum: Secondary | ICD-10-CM | POA: Diagnosis not present

## 2020-11-05 DIAGNOSIS — R1906 Epigastric swelling, mass or lump: Secondary | ICD-10-CM | POA: Insufficient documentation

## 2020-11-05 MED ORDER — PROCHLORPERAZINE MALEATE 10 MG PO TABS: 10.0000 mg | ORAL_TABLET | Freq: Four times a day (QID) | ORAL | 0 refills | Status: AC | PRN

## 2020-11-05 MED ORDER — INFLUENZA VAC A&B SA ADJ QUAD 0.5 ML IM PRSY
0.5000 mL | PREFILLED_SYRINGE | Freq: Once | INTRAMUSCULAR | Status: AC
  Administered 2020-11-05: 0.5 mL via INTRAMUSCULAR
  Filled 2020-11-05: qty 0.5

## 2020-11-05 NOTE — Progress Notes (Signed)
Patient come in for office visit. Flu vaccine ordered. See MAR for administration information. Patient remained stable throughout injection. Patient discharged from clinic via wheelchair with husband in stable condition.

## 2020-11-05 NOTE — Patient Instructions (Signed)
[place] at [facility] Discharge Instructions  You were seen and examined today by Dr. Delton Coombes.   Please proceed with a PET scan and EGD. Dr. Delton Coombes also recommends that you speak with our nutritionist. Follow-up with Dr. Delton Coombes accordingly.  Take Xanax one hour prior to PET scan.   Thank you for choosing [place] at [facility] to provide your oncology and hematology care.  To afford each patient quality time with our provider, please arrive at least 15 minutes before your scheduled appointment time.   If you have a lab appointment with the [facility] please come in thru the Main Entrance and check in at the main information desk.  You need to re-schedule your appointment should you arrive 10 or more minutes late.  We strive to give you quality time with our providers, and arriving late affects you and other patients whose appointments are after yours.  Also, if you no show three or more times for appointments you may be dismissed from the clinic at the providers discretion.     Again, thank you for choosing [facility].  Our hope is that these requests will decrease the amount of time that you wait before being seen by our physicians.       _____________________________________________________________  Should you have questions after your visit to [facility], please contact our office at [phone] and follow the prompts.  Our office hours are 8:00 a.m. and 4:30 p.m. Monday - Friday.  Please note that voicemails left after 4:00 p.m. may not be returned until the following business day.  We are closed weekends and major holidays.  You do have access to a nurse 24-7, just call the main number to the clinic [phone] and do not press any options, hold on the line and a nurse will answer the phone.    For prescription refill requests, have your pharmacy contact our office and allow 72 hours.    Due  to Covid, you will need to wear a mask upon entering the hospital. If you do not have a mask, a mask will be given to you at the Main Entrance upon arrival. For doctor visits, patients may have 1 support person age 35 or older with them. For treatment visits, patients can not have anyone with them due to social distancing guidelines and our immunocompromised population.

## 2020-11-06 NOTE — Patient Instructions (Signed)
Olivia Blanchard  11/06/2020     @PREFPERIOPPHARMACY @   Your procedure is scheduled on  11/12/2020.   Report to [facility] at  1045 AM    Call this number if you have problems the morning of surgery:  [phone]   Remember:  Follow the diet and prep instructions given to you by the office.    Take these medicines the morning of surgery with A SIP OF WATER      pepcid, levothyroxine, metoprolol, compazine (if needed).     Do not wear jewelry, make-up or nail polish.  Do not wear lotions, powders, or perfumes, or deodorant.  Do not shave 48 hours prior to surgery.  Men may shave face and neck.  Do not bring valuables to the hospital.  [facility] is not responsible for any belongings or valuables.  Contacts, dentures or bridgework may not be worn into surgery.  Leave your suitcase in the car.  After surgery it may be brought to your room.  For patients admitted to the hospital, discharge time will be determined by your treatment team.  Patients discharged the day of surgery will not be allowed to drive home and must have someone with them for 24 hours.    Special instructions:   DO NOT smoke tobacco or vape for 24 hours before your procedure.  Please read over the following fact sheets that you were given. Anesthesia Post-op Instructions and Care and Recovery After Surgery      Upper Endoscopy, Adult, Care After This sheet gives you information about how to care for yourself after your procedure. Your health care provider may also give you more specific instructions. If you have problems or questions, contact your health care provider. What can I expect after the procedure? After the procedure, it is common to have: A sore throat. Mild stomach pain or discomfort. Bloating. Nausea. Follow these instructions at home:  Follow instructions from your health care provider about what to eat or drink after your procedure. Return to your normal activities as  told by your health care provider. Ask your health care provider what activities are safe for you. Take over-the-counter and prescription medicines only as told by your health care provider. If you were given a sedative during the procedure, it can affect you for several hours. Do not drive or operate machinery until your health care provider says that it is safe. Keep all follow-up visits as told by your health care provider. This is important. Contact a health care provider if you have: A sore throat that lasts longer than one day. Trouble swallowing. Get help right away if: You vomit blood or your vomit looks like coffee grounds. You have: A fever. Bloody, black, or tarry stools. A severe sore throat or you cannot swallow. Difficulty breathing. Severe pain in your chest or abdomen. Summary After the procedure, it is common to have a sore throat, mild stomach discomfort, bloating, and nausea. If you were given a sedative during the procedure, it can affect you for several hours. Do not drive or operate machinery until your health care provider says that it is safe. Follow instructions from your health care provider about what to eat or drink after your procedure. Return to your normal activities as told by your health care provider. This information is not intended to replace advice given to you by your health care provider. Make sure you discuss any questions you have with your health  care provider. Document Revised: 12/20/2018 Document Reviewed: 05/24/2017 Elsevier Patient Education  2022 [place]. Colonoscopy, Adult, Care After This sheet gives you information about how to care for yourself after your procedure. Your health care provider may also give you more specific instructions. If you have problems or questions, contact your health care provider. What can I expect after the procedure? After the procedure, it is common to have: A small amount of blood in your stool for 24  hours after the procedure. Some gas. Mild cramping or bloating of your abdomen. Follow these instructions at home: Eating and drinking  Drink enough fluid to keep your urine pale yellow. Follow instructions from your health care provider about eating or drinking restrictions. Resume your normal diet as instructed by your health care provider. Avoid heavy or fried foods that are hard to digest. Activity Rest as told by your health care provider. Avoid sitting for a long time without moving. Get up to take short walks every 1-2 hours. This is important to improve blood flow and breathing. Ask for help if you feel weak or unsteady. Return to your normal activities as told by your health care provider. Ask your health care provider what activities are safe for you. Managing cramping and bloating  Try walking around when you have cramps or feel bloated. Apply heat to your abdomen as told by your health care provider. Use the heat source that your health care provider recommends, such as a moist heat pack or a heating pad. Place a towel between your skin and the heat source. Leave the heat on for 20-30 minutes. Remove the heat if your skin turns bright red. This is especially important if you are unable to feel pain, heat, or cold. You may have a greater risk of getting burned. General instructions If you were given a sedative during the procedure, it can affect you for several hours. Do not drive or operate machinery until your health care provider says that it is safe. For the first 24 hours after the procedure: Do not sign important documents. Do not drink alcohol. Do your regular daily activities at a slower pace than normal. Eat soft foods that are easy to digest. Take over-the-counter and prescription medicines only as told by your health care provider. Keep all follow-up visits as told by your health care provider. This is important. Contact a health care provider if: You have blood in  your stool 2-3 days after the procedure. Get help right away if you have: More than a small spotting of blood in your stool. Large blood clots in your stool. Swelling of your abdomen. Nausea or vomiting. A fever. Increasing pain in your abdomen that is not relieved with medicine. Summary After the procedure, it is common to have a small amount of blood in your stool. You may also have mild cramping and bloating of your abdomen. If you were given a sedative during the procedure, it can affect you for several hours. Do not drive or operate machinery until your health care provider says that it is safe. Get help right away if you have a lot of blood in your stool, nausea or vomiting, a fever, or increased pain in your abdomen. This information is not intended to replace advice given to you by your health care provider. Make sure you discuss any questions you have with your health care provider. Document Revised: 12/16/2018 Document Reviewed: 07/18/2018 Elsevier Patient Education  [place] After This sheet  gives you information about how to care for yourself after your procedure. Your health care provider may also give you more specific instructions. If you have problems or questions, contact your health care provider. What can I expect after the procedure? After the procedure, it is common to have: Tiredness. Forgetfulness about what happened after the procedure. Impaired judgment for important decisions. Nausea or vomiting. Some difficulty with balance. Follow these instructions at home: For the time period you were told by your health care provider:   Rest as needed. Do not participate in activities where you could fall or become injured. Do not drive or use machinery. Do not drink alcohol. Do not take sleeping pills or medicines that cause drowsiness. Do not make important decisions or sign legal documents. Do not take care of children on  your own. Eating and drinking Follow the diet that is recommended by your health care provider. Drink enough fluid to keep your urine pale yellow. If you vomit: Drink water, juice, or soup when you can drink without vomiting. Make sure you have little or no nausea before eating solid foods. General instructions Have a responsible adult stay with you for the time you are told. It is important to have someone help care for you until you are awake and alert. Take over-the-counter and prescription medicines only as told by your health care provider. If you have sleep apnea, surgery and certain medicines can increase your risk for breathing problems. Follow instructions from your health care provider about wearing your sleep device: Anytime you are sleeping, including during daytime naps. While taking prescription pain medicines, sleeping medicines, or medicines that make you drowsy. Avoid smoking. Keep all follow-up visits as told by your health care provider. This is important. Contact a health care provider if: You keep feeling nauseous or you keep vomiting. You feel light-headed. You are still sleepy or having trouble with balance after 24 hours. You develop a rash. You have a fever. You have redness or swelling around the IV site. Get help right away if: You have trouble breathing. You have new-onset confusion at home. Summary For several hours after your procedure, you may feel tired. You may also be forgetful and have poor judgment. Have a responsible adult stay with you for the time you are told. It is important to have someone help care for you until you are awake and alert. Rest as told. Do not drive or operate machinery. Do not drink alcohol or take sleeping pills. Get help right away if you have trouble breathing, or if you suddenly become confused. This information is not intended to replace advice given to you by your health care provider. Make sure you discuss any questions  you have with your health care provider. Document Revised: 09/07/2019 Document Reviewed: 11/24/2018 Elsevier Patient Education  2022 [employer].

## 2020-11-07 ENCOUNTER — Encounter (HOSPITAL_COMMUNITY)
Admission: RE | Admit: 2020-11-07 | Discharge: 2020-11-07 | Disposition: A | Discharge status: Home / Self Care | Payer: PPO | Source: Ambulatory Visit | Attending: Gastroenterology | Admitting: Gastroenterology

## 2020-11-07 ENCOUNTER — Encounter (HOSPITAL_COMMUNITY): Payer: Self-pay

## 2020-11-07 DIAGNOSIS — Z01810 Encounter for preprocedural cardiovascular examination: Secondary | ICD-10-CM | POA: Insufficient documentation

## 2020-11-12 ENCOUNTER — Ambulatory Visit (HOSPITAL_COMMUNITY): Admission: RE | Admit: 2020-11-12 | Payer: PPO | Source: Home / Self Care | Admitting: Gastroenterology

## 2020-11-12 ENCOUNTER — Encounter (HOSPITAL_COMMUNITY): Admission: RE | Payer: Self-pay | Source: Home / Self Care

## 2020-11-12 SURGERY — COLONOSCOPY WITH PROPOFOL
Anesthesia: Monitor Anesthesia Care

## 2020-11-13 ENCOUNTER — Telehealth: Payer: Self-pay | Admitting: Family Medicine

## 2020-11-13 NOTE — Chronic Care Management (AMB) (Signed)
  Chronic Care Management   Outreach Note  11/13/2020 Name: Olivia Blanchard MRN: [id number] DOB: [birth year]  Referred by: Caren Macadam, MD Reason for referral : No chief complaint on file.   An unsuccessful telephone outreach was attempted today. The patient was referred to the pharmacist for assistance with care management and care coordination.   Follow Up Plan:   Tatjana Dellinger Upstream Scheduler

## 2020-11-14 ENCOUNTER — Other Ambulatory Visit (HOSPITAL_COMMUNITY): Payer: PPO

## 2020-11-18 ENCOUNTER — Other Ambulatory Visit (HOSPITAL_COMMUNITY): Payer: Self-pay | Admitting: Hematology

## 2020-11-18 ENCOUNTER — Ambulatory Visit (HOSPITAL_COMMUNITY): Payer: PPO | Admitting: Hematology

## 2020-11-18 ENCOUNTER — Ambulatory Visit (HOSPITAL_COMMUNITY): Payer: PPO | Admitting: Dietician

## 2020-11-18 NOTE — Progress Notes (Signed)
Patient cancelled nutrition appointment. Per scheduling, patient declined to reschedule.

## 2020-11-20 ENCOUNTER — Telehealth: Payer: Self-pay | Admitting: Family Medicine

## 2020-11-20 NOTE — Progress Notes (Signed)
  Chronic Care Management   Outreach Note  11/20/2020 Name: Olivia Blanchard MRN: [id number] DOB: 06/22/1950  Referred by: Caren Macadam, MD Reason for referral : No chief complaint on file.   A second unsuccessful telephone outreach was attempted today. The patient was referred to pharmacist for assistance with care management and care coordination.  Follow Up Plan:   Tatjana Dellinger Upstream Scheduler

## 2020-11-21 ENCOUNTER — Other Ambulatory Visit (HOSPITAL_COMMUNITY): Payer: PPO

## 2020-11-26 ENCOUNTER — Telehealth: Payer: Self-pay | Admitting: Family Medicine

## 2020-11-26 NOTE — Chronic Care Management (AMB) (Signed)
  Chronic Care Management   Outreach Note  11/26/2020 Name: Olivia Blanchard MRN: [id number] DOB: [birth year]  Referred by: Caren Macadam, MD Reason for referral : No chief complaint on file.   Third unsuccessful telephone outreach was attempted today. The patient was referred to the pharmacist for assistance with care management and care coordination.   Follow Up Plan:   Tatjana Dellinger Upstream Scheduler

## 2020-12-05 ENCOUNTER — Ambulatory Visit (HOSPITAL_COMMUNITY): Payer: PPO | Admitting: Hematology

## 2020-12-09 ENCOUNTER — Ambulatory Visit: Payer: PPO | Admitting: Endocrinology

## 2020-12-12 ENCOUNTER — Ambulatory Visit (HOSPITAL_COMMUNITY): Admission: RE | Admit: 2020-12-12 | Payer: PPO | Source: Ambulatory Visit

## 2020-12-16 ENCOUNTER — Inpatient Hospital Stay (HOSPITAL_COMMUNITY)
Admission: EM | Admit: 2020-12-16 | Discharge: 2020-12-20 | DRG: 640 | Disposition: A | Discharge status: Hospice | Payer: PPO | Attending: Internal Medicine | Admitting: Internal Medicine

## 2020-12-16 ENCOUNTER — Observation Stay (HOSPITAL_COMMUNITY): Payer: PPO

## 2020-12-16 ENCOUNTER — Encounter (HOSPITAL_COMMUNITY): Payer: Self-pay

## 2020-12-16 ENCOUNTER — Emergency Department (HOSPITAL_COMMUNITY): Payer: PPO

## 2020-12-16 ENCOUNTER — Other Ambulatory Visit: Payer: Self-pay

## 2020-12-16 DIAGNOSIS — R64 Cachexia: Secondary | ICD-10-CM | POA: Diagnosis present

## 2020-12-16 DIAGNOSIS — R933 Abnormal findings on diagnostic imaging of other parts of digestive tract: Secondary | ICD-10-CM | POA: Diagnosis not present

## 2020-12-16 DIAGNOSIS — D494 Neoplasm of unspecified behavior of bladder: Secondary | ICD-10-CM | POA: Diagnosis present

## 2020-12-16 DIAGNOSIS — D649 Anemia, unspecified: Secondary | ICD-10-CM

## 2020-12-16 DIAGNOSIS — Z923 Personal history of irradiation: Secondary | ICD-10-CM

## 2020-12-16 DIAGNOSIS — R339 Retention of urine, unspecified: Secondary | ICD-10-CM | POA: Diagnosis present

## 2020-12-16 DIAGNOSIS — B372 Candidiasis of skin and nail: Secondary | ICD-10-CM | POA: Diagnosis present

## 2020-12-16 DIAGNOSIS — R911 Solitary pulmonary nodule: Secondary | ICD-10-CM | POA: Diagnosis present

## 2020-12-16 DIAGNOSIS — I6340 Cerebral infarction due to embolism of unspecified cerebral artery: Secondary | ICD-10-CM | POA: Diagnosis present

## 2020-12-16 DIAGNOSIS — E890 Postprocedural hypothyroidism: Secondary | ICD-10-CM | POA: Diagnosis present

## 2020-12-16 DIAGNOSIS — K209 Esophagitis, unspecified without bleeding: Secondary | ICD-10-CM | POA: Diagnosis not present

## 2020-12-16 DIAGNOSIS — R634 Abnormal weight loss: Secondary | ICD-10-CM | POA: Diagnosis not present

## 2020-12-16 DIAGNOSIS — G319 Degenerative disease of nervous system, unspecified: Secondary | ICD-10-CM | POA: Diagnosis not present

## 2020-12-16 DIAGNOSIS — H0520 Unspecified exophthalmos: Secondary | ICD-10-CM | POA: Diagnosis not present

## 2020-12-16 DIAGNOSIS — R627 Adult failure to thrive: Secondary | ICD-10-CM | POA: Diagnosis present

## 2020-12-16 DIAGNOSIS — Y842 Radiological procedure and radiotherapy as the cause of abnormal reaction of the patient, or of later complication, without mention of misadventure at the time of the procedure: Secondary | ICD-10-CM | POA: Diagnosis present

## 2020-12-16 DIAGNOSIS — M4854XA Collapsed vertebra, not elsewhere classified, thoracic region, initial encounter for fracture: Secondary | ICD-10-CM | POA: Diagnosis present

## 2020-12-16 DIAGNOSIS — H538 Other visual disturbances: Secondary | ICD-10-CM | POA: Diagnosis not present

## 2020-12-16 DIAGNOSIS — E43 Unspecified severe protein-calorie malnutrition: Secondary | ICD-10-CM | POA: Diagnosis present

## 2020-12-16 DIAGNOSIS — Z888 Allergy status to other drugs, medicaments and biological substances status: Secondary | ICD-10-CM

## 2020-12-16 DIAGNOSIS — K5732 Diverticulitis of large intestine without perforation or abscess without bleeding: Secondary | ICD-10-CM | POA: Diagnosis present

## 2020-12-16 DIAGNOSIS — Z87891 Personal history of nicotine dependence: Secondary | ICD-10-CM

## 2020-12-16 DIAGNOSIS — I7143 Infrarenal abdominal aortic aneurysm, without rupture: Secondary | ICD-10-CM | POA: Diagnosis present

## 2020-12-16 DIAGNOSIS — E860 Dehydration: Secondary | ICD-10-CM | POA: Diagnosis present

## 2020-12-16 DIAGNOSIS — I639 Cerebral infarction, unspecified: Secondary | ICD-10-CM | POA: Diagnosis not present

## 2020-12-16 DIAGNOSIS — C679 Malignant neoplasm of bladder, unspecified: Secondary | ICD-10-CM

## 2020-12-16 DIAGNOSIS — R7401 Elevation of levels of liver transaminase levels: Secondary | ICD-10-CM | POA: Diagnosis present

## 2020-12-16 DIAGNOSIS — Z885 Allergy status to narcotic agent status: Secondary | ICD-10-CM

## 2020-12-16 DIAGNOSIS — I10 Essential (primary) hypertension: Secondary | ICD-10-CM | POA: Diagnosis present

## 2020-12-16 DIAGNOSIS — R404 Transient alteration of awareness: Secondary | ICD-10-CM | POA: Diagnosis not present

## 2020-12-16 DIAGNOSIS — Z884 Allergy status to anesthetic agent status: Secondary | ICD-10-CM

## 2020-12-16 DIAGNOSIS — Z6825 Body mass index (BMI) 25.0-25.9, adult: Secondary | ICD-10-CM

## 2020-12-16 DIAGNOSIS — L0390 Cellulitis, unspecified: Secondary | ICD-10-CM | POA: Diagnosis present

## 2020-12-16 DIAGNOSIS — R112 Nausea with vomiting, unspecified: Secondary | ICD-10-CM | POA: Diagnosis present

## 2020-12-16 DIAGNOSIS — K219 Gastro-esophageal reflux disease without esophagitis: Secondary | ICD-10-CM | POA: Diagnosis not present

## 2020-12-16 DIAGNOSIS — I714 Abdominal aortic aneurysm, without rupture, unspecified: Secondary | ICD-10-CM | POA: Diagnosis not present

## 2020-12-16 DIAGNOSIS — D61818 Other pancytopenia: Secondary | ICD-10-CM | POA: Diagnosis present

## 2020-12-16 DIAGNOSIS — R110 Nausea: Secondary | ICD-10-CM | POA: Diagnosis not present

## 2020-12-16 DIAGNOSIS — Z20822 Contact with and (suspected) exposure to covid-19: Secondary | ICD-10-CM | POA: Diagnosis present

## 2020-12-16 DIAGNOSIS — K449 Diaphragmatic hernia without obstruction or gangrene: Secondary | ICD-10-CM | POA: Diagnosis present

## 2020-12-16 DIAGNOSIS — E871 Hypo-osmolality and hyponatremia: Secondary | ICD-10-CM | POA: Diagnosis present

## 2020-12-16 DIAGNOSIS — I6521 Occlusion and stenosis of right carotid artery: Secondary | ICD-10-CM | POA: Diagnosis present

## 2020-12-16 DIAGNOSIS — E876 Hypokalemia: Principal | ICD-10-CM | POA: Diagnosis present

## 2020-12-16 DIAGNOSIS — N3289 Other specified disorders of bladder: Secondary | ICD-10-CM | POA: Diagnosis not present

## 2020-12-16 DIAGNOSIS — Z8639 Personal history of other endocrine, nutritional and metabolic disease: Secondary | ICD-10-CM

## 2020-12-16 DIAGNOSIS — K2100 Gastro-esophageal reflux disease with esophagitis, without bleeding: Secondary | ICD-10-CM | POA: Diagnosis present

## 2020-12-16 DIAGNOSIS — S22080A Wedge compression fracture of T11-T12 vertebra, initial encounter for closed fracture: Secondary | ICD-10-CM | POA: Diagnosis not present

## 2020-12-16 DIAGNOSIS — Z8673 Personal history of transient ischemic attack (TIA), and cerebral infarction without residual deficits: Secondary | ICD-10-CM | POA: Diagnosis not present

## 2020-12-16 DIAGNOSIS — I6782 Cerebral ischemia: Secondary | ICD-10-CM | POA: Diagnosis not present

## 2020-12-16 DIAGNOSIS — R630 Anorexia: Secondary | ICD-10-CM | POA: Diagnosis not present

## 2020-12-16 DIAGNOSIS — H02401 Unspecified ptosis of right eyelid: Secondary | ICD-10-CM | POA: Diagnosis present

## 2020-12-16 DIAGNOSIS — Z833 Family history of diabetes mellitus: Secondary | ICD-10-CM

## 2020-12-16 DIAGNOSIS — K3189 Other diseases of stomach and duodenum: Secondary | ICD-10-CM | POA: Diagnosis not present

## 2020-12-16 DIAGNOSIS — Z8349 Family history of other endocrine, nutritional and metabolic diseases: Secondary | ICD-10-CM

## 2020-12-16 DIAGNOSIS — R531 Weakness: Secondary | ICD-10-CM | POA: Diagnosis present

## 2020-12-16 DIAGNOSIS — I6523 Occlusion and stenosis of bilateral carotid arteries: Secondary | ICD-10-CM | POA: Diagnosis not present

## 2020-12-16 DIAGNOSIS — R29700 NIHSS score 0: Secondary | ICD-10-CM | POA: Diagnosis present

## 2020-12-16 DIAGNOSIS — I63233 Cerebral infarction due to unspecified occlusion or stenosis of bilateral carotid arteries: Secondary | ICD-10-CM | POA: Diagnosis not present

## 2020-12-16 DIAGNOSIS — Z801 Family history of malignant neoplasm of trachea, bronchus and lung: Secondary | ICD-10-CM

## 2020-12-16 DIAGNOSIS — K2970 Gastritis, unspecified, without bleeding: Secondary | ICD-10-CM | POA: Diagnosis not present

## 2020-12-16 DIAGNOSIS — I959 Hypotension, unspecified: Secondary | ICD-10-CM | POA: Diagnosis not present

## 2020-12-16 DIAGNOSIS — Z825 Family history of asthma and other chronic lower respiratory diseases: Secondary | ICD-10-CM

## 2020-12-16 DIAGNOSIS — R970 Elevated carcinoembryonic antigen [CEA]: Secondary | ICD-10-CM | POA: Diagnosis present

## 2020-12-16 DIAGNOSIS — Z9049 Acquired absence of other specified parts of digestive tract: Secondary | ICD-10-CM

## 2020-12-16 DIAGNOSIS — Z8551 Personal history of malignant neoplasm of bladder: Secondary | ICD-10-CM | POA: Diagnosis not present

## 2020-12-16 LAB — CBC WITH DIFFERENTIAL/PLATELET
Abs Immature Granulocytes: 0.06 10*3/uL (ref 0.00–0.07)
Basophils Absolute: 0 10*3/uL (ref 0.0–0.1)
Basophils Relative: 0 %
Eosinophils Absolute: 0 10*3/uL (ref 0.0–0.5)
Eosinophils Relative: 0 %
HCT: 26.8 % — ABNORMAL LOW (ref 36.0–46.0)
Hemoglobin: 8.2 g/dL — ABNORMAL LOW (ref 12.0–15.0)
Immature Granulocytes: 1 %
Lymphocytes Relative: 17 %
Lymphs Abs: 0.9 10*3/uL (ref 0.7–4.0)
MCH: 27.5 pg (ref 26.0–34.0)
MCHC: 30.6 g/dL (ref 30.0–36.0)
MCV: 89.9 fL (ref 80.0–100.0)
Monocytes Absolute: 0.3 10*3/uL (ref 0.1–1.0)
Monocytes Relative: 6 %
Neutro Abs: 3.9 10*3/uL (ref 1.7–7.7)
Neutrophils Relative %: 76 %
Platelets: 120 10*3/uL — ABNORMAL LOW (ref 150–400)
RBC: 2.98 MIL/uL — ABNORMAL LOW (ref 3.87–5.11)
RDW: 16.8 % — ABNORMAL HIGH (ref 11.5–15.5)
WBC: 5.2 10*3/uL (ref 4.0–10.5)
nRBC: 0 % (ref 0.0–0.2)

## 2020-12-16 LAB — COMPREHENSIVE METABOLIC PANEL
ALT: 18 U/L (ref 0–44)
AST: 59 U/L — ABNORMAL HIGH (ref 15–41)
Albumin: 2.4 g/dL — ABNORMAL LOW (ref 3.5–5.0)
Alkaline Phosphatase: 84 U/L (ref 38–126)
Anion gap: 17 — ABNORMAL HIGH (ref 5–15)
BUN: 10 mg/dL (ref 8–23)
CO2: 27 mmol/L (ref 22–32)
Calcium: 8 mg/dL — ABNORMAL LOW (ref 8.9–10.3)
Chloride: 85 mmol/L — ABNORMAL LOW (ref 98–111)
Creatinine, Ser: 0.6 mg/dL (ref 0.44–1.00)
GFR, Estimated: >60 mL/min (ref >60)
Glucose, Bld: 94 mg/dL (ref 70–99)
Potassium: 2.5 mmol/L — CL (ref 3.5–5.1)
Sodium: 129 mmol/L — ABNORMAL LOW (ref 135–145)
Total Bilirubin: 1.6 mg/dL — ABNORMAL HIGH (ref 0.3–1.2)
Total Protein: 6.7 g/dL (ref 6.5–8.1)

## 2020-12-16 LAB — MAGNESIUM: Magnesium: 1.6 mg/dL — ABNORMAL LOW (ref 1.7–2.4)

## 2020-12-16 LAB — ETHANOL: Alcohol, Ethyl (B): <10 mg/dL (ref <10)

## 2020-12-16 LAB — RESP PANEL BY RT-PCR (FLU A&B, COVID) ARPGX2
Influenza A by PCR: NEGATIVE
Influenza B by PCR: NEGATIVE
SARS Coronavirus 2 by RT PCR: NEGATIVE

## 2020-12-16 MED ORDER — ACETAMINOPHEN 325 MG PO TABS
650.0000 mg | ORAL_TABLET | Freq: Four times a day (QID) | ORAL | Status: DC | PRN
  Filled 2020-12-16: qty 2

## 2020-12-16 MED ORDER — POTASSIUM CHLORIDE 10 MEQ/100ML IV SOLN
10.0000 meq | Freq: Once | INTRAVENOUS | Status: AC
  Administered 2020-12-16: 10 meq via INTRAVENOUS
  Filled 2020-12-16: qty 100

## 2020-12-16 MED ORDER — ONDANSETRON HCL 4 MG/2ML IJ SOLN: 4.0000 mg | Freq: Four times a day (QID) | INTRAMUSCULAR | Status: DC | PRN

## 2020-12-16 MED ORDER — ENOXAPARIN SODIUM 40 MG/0.4ML IJ SOSY: 40.0000 mg | PREFILLED_SYRINGE | INTRAMUSCULAR | Status: DC

## 2020-12-16 MED ORDER — SODIUM CHLORIDE 0.9 % IV SOLN: INTRAVENOUS | Status: AC

## 2020-12-16 MED ORDER — NYSTATIN 100000 UNIT/GM EX POWD
1.0000 "application " | Freq: Three times a day (TID) | CUTANEOUS | Status: DC
  Administered 2020-12-16 – 2020-12-20 (×10): 1 via TOPICAL
  Filled 2020-12-16 (×2): qty 15

## 2020-12-16 MED ORDER — SODIUM CHLORIDE 0.9 % IV SOLN: INTRAVENOUS | Status: DC | PRN

## 2020-12-16 MED ORDER — ACETAMINOPHEN 650 MG RE SUPP: 650.0000 mg | Freq: Four times a day (QID) | RECTAL | Status: DC | PRN

## 2020-12-16 MED ORDER — POTASSIUM CHLORIDE 10 MEQ/100ML IV SOLN
10.0000 meq | INTRAVENOUS | Status: AC
  Administered 2020-12-16 (×2): 10 meq via INTRAVENOUS
  Filled 2020-12-16 (×2): qty 100

## 2020-12-16 MED ORDER — SODIUM CHLORIDE 0.9 % IV SOLN
100.0000 mg | Freq: Two times a day (BID) | INTRAVENOUS | Status: DC
  Administered 2020-12-16 – 2020-12-20 (×8): 100 mg via INTRAVENOUS
  Filled 2020-12-16 (×16): qty 100

## 2020-12-16 MED ORDER — ONDANSETRON HCL 4 MG PO TABS
4.0000 mg | ORAL_TABLET | Freq: Four times a day (QID) | ORAL | Status: DC | PRN
  Administered 2020-12-17: 4 mg via ORAL
  Filled 2020-12-16: qty 1

## 2020-12-16 MED ORDER — POLYETHYLENE GLYCOL 3350 17 G PO PACK: 17.0000 g | PACK | Freq: Every day | ORAL | Status: DC | PRN

## 2020-12-16 MED ORDER — HEPARIN SODIUM (PORCINE) 5000 UNIT/ML IJ SOLN
5000.0000 [IU] | Freq: Three times a day (TID) | INTRAMUSCULAR | Status: DC
  Administered 2020-12-16 – 2020-12-17 (×2): 5000 [IU] via SUBCUTANEOUS
  Filled 2020-12-16 (×2): qty 1

## 2020-12-16 MED ORDER — IOHEXOL 300 MG/ML  SOLN
100.0000 mL | Freq: Once | INTRAMUSCULAR | Status: AC | PRN
  Administered 2020-12-16: 80 mL via INTRAVENOUS

## 2020-12-16 MED ORDER — MAGNESIUM SULFATE 2 GM/50ML IV SOLN
2.0000 g | Freq: Once | INTRAVENOUS | Status: AC
  Administered 2020-12-16: 2 g via INTRAVENOUS
  Filled 2020-12-16: qty 50

## 2020-12-16 MED ORDER — LEVOTHYROXINE SODIUM 112 MCG PO TABS
112.0000 ug | ORAL_TABLET | Freq: Every day | ORAL | Status: DC
  Administered 2020-12-17 – 2020-12-20 (×3): 112 ug via ORAL
  Filled 2020-12-16 (×4): qty 1

## 2020-12-16 MED ORDER — SODIUM CHLORIDE 0.9 % IV BOLUS
1000.0000 mL | Freq: Once | INTRAVENOUS | Status: AC
  Administered 2020-12-16: 1000 mL via INTRAVENOUS

## 2020-12-16 MED ORDER — POTASSIUM CHLORIDE CRYS ER 20 MEQ PO TBCR
40.0000 meq | EXTENDED_RELEASE_TABLET | ORAL | Status: AC
  Administered 2020-12-16 – 2020-12-17 (×2): 40 meq via ORAL
  Filled 2020-12-16 (×2): qty 2

## 2020-12-16 MED ORDER — ALPRAZOLAM 0.25 MG PO TABS
0.2500 mg | ORAL_TABLET | Freq: Two times a day (BID) | ORAL | Status: DC | PRN
  Administered 2020-12-17 – 2020-12-20 (×4): 0.25 mg via ORAL
  Filled 2020-12-16 (×4): qty 1

## 2020-12-16 NOTE — H&P (Addendum)
History and Physical    Olivia Blanchard SAY:[id number] DOB: 11/24/1950 DOA: 12/16/2020  PCP: Caren Macadam, MD   Patient coming from: Home  I have personally briefly reviewed patient's old medical records in [place]  Chief Complaint: Poor oral intake, abdominal mass  HPI: Olivia Blanchard is a 70 y.o. female with medical history significant for AAA, Graves' disease, hypertension, stomach mass. Patient presented to the ED today with complaints of generalized weakness, unable to eat, patient reports nausea but denies vomiting to me.  No abdominal pain.  Reports symptoms have been ongoing since September.  Spouse is at bedside and confirmed that patient has lost about 50 pounds since September.  She had recent CT abdomen pelvis done done at  [facility]- 09/2020, which showed wall thickening of the gastric cardia question mass.  Followed up with oncology.  She was to have EGD 11/8/ 22 and PET/CT done for staging and diagnosis of possible malignancy, but she was unable to do it.   Tells me over 4 months, vision in her right eye has declined.  She also has mild droop of her right eyelid, which has been present for a while, but spouse says this is not normal.  No headaches.  No focal weakness of extremities.   ED Course: Stable vitals.  Potassium 2.5.  Magnesium 1.6.  CT Chest and abdomen in ED today shows a right lateral bladder wall mass concerning for primary bladder carcinoma.  Chronic sigmoid diverticulitis with possible colocolonic fistula. Hospitalist to admit for generalized weakness, electrolyte abnormalities.  Review of Systems: As per HPI all other systems reviewed and negative.  Past Medical History:  Diagnosis Date   Grave's disease    HYPERTENSION 09/30/2007   HYPOTHYROIDISM 09/30/2007   Tobacco abuse     Past Surgical History:  Procedure Laterality Date   APPENDECTOMY     cataract surgery Right    cataract surgery Left      reports that she quit  smoking about 2 months ago. Her smoking use included cigarettes. She smoked an average of 1 pack per day. She has never used smokeless tobacco. She reports that she does not drink alcohol and does not use drugs.  Allergies  Allergen Reactions   Codeine Phosphate    Lidocaine Palpitations    "stuff the dentist uses to numb your teeth" causes tachycardia.   Prednisone Palpitations    tachycardia    Family History  Problem Relation Age of Onset   Diabetes Mother    Thyroid disease Mother    Cancer Neg Hx        sister s- lung Ca, copd    Prior to Admission medications   Medication Sig Start Date End Date Taking? Authorizing Provider  ALPRAZolam (XANAX) 0.25 MG tablet TAKE 1 TABLET BY MOUTH TWICE DAILY AS NEEDED FOR ANXIETY Patient taking differently: Take 0.25 mg by mouth 2 (two) times daily as needed for anxiety. 11/18/20  Yes Derek Jack, MD  folic acid (FOLVITE) 1 MG tablet Take 1 tablet (1 mg total) by mouth daily. 10/23/20  Yes Derek Jack, MD  levothyroxine (SYNTHROID) 112 MCG tablet TAKE 1 TABLET BY MOUTH DAILY Patient taking differently: Take 112 mcg by mouth daily at 6 (six) AM. 08/30/20  Yes Elayne Snare, MD  potassium chloride (KLOR-CON) 10 MEQ tablet Take 1 tablet PO BID x 7 days, then take 1 tablet PO daily Patient taking differently: Take 10 mEq by mouth daily. 10/02/20  Yes Koberlein, Junell C,  MD  prochlorperazine (COMPAZINE) 10 MG tablet Take 1 tablet (10 mg total) by mouth every 6 (six) hours as needed for nausea or vomiting. 11/05/20  Yes Derek Jack, MD  famotidine (PEPCID) 20 MG tablet Take 20 mg by mouth daily. Patient not taking: Reported on 12/16/2020    [provider]  metoprolol succinate (TOPROL-XL) 50 MG 24 hr tablet Take 1 tablet (50 mg total) by mouth daily. Patient not taking: Reported on 12/16/2020 10/23/20   Caren Macadam, MD  polyethylene glycol-electrolytes (TRILYTE) 420 g solution Take 4,000 mLs by mouth as  directed. Patient not taking: Reported on 12/16/2020 10/30/20   Harvel Quale, MD    Physical Exam: Vitals:   12/16/20 1300 12/16/20 1430 12/16/20 1500 12/16/20 1530  BP: 99/73 111/77 106/67 100/67  Pulse: 74 72 71 70  Resp: 14 (!) 21 16 16   Temp:      TempSrc:      SpO2: 100% 100% 100% 100%  Weight:      Height:        Constitutional: Chronically ill-appearing calm, appears comfortable Vitals:   12/16/20 1300 12/16/20 1430 12/16/20 1500 12/16/20 1530  BP: 99/73 111/77 106/67 100/67  Pulse: 74 72 71 70  Resp: 14 (!) 21 16 16   Temp:      TempSrc:      SpO2: 100% 100% 100% 100%  Weight:      Height:       Eyes: PERRL, lids and conjunctivae normal ENMT: Mucous membranes are dry.  Neck: normal, supple, no masses, no thyromegaly Respiratory: clear to auscultation bilaterally, no wheezing, no crackles. Normal respiratory effort. No accessory muscle use.  Cardiovascular: Regular rate and rhythm, no murmurs / rubs / gallops. No extremity edema. 2+ pedal pulses.  Abdomen: Horizontal fold along umbilicus, with mild purulent discharge, and erythema with satellite lesions suggesting Candida infection, also presence of foul smell.  Probed umbilicus, no wound, ulcers or opening.  Otherwise no tenderness, no masses palpated. No hepatosplenomegaly. Bowel sounds positive.  Musculoskeletal: no clubbing / cyanosis. No joint deformity upper and lower extremities. Good ROM, no contractures. Normal muscle tone.  Skin: no rashes, lesions, ulcers. No induration Neurologic: Slight ptosis to right eye.  Able to finger count bilaterally, patient reports vision is worse on the right, but finger counting is worse on the left..  Smile symmetric.  No evidence of aphasia.  4+5 strength in all extremities. Psychiatric: Normal judgment and insight. Alert and oriented x 3. Normal mood.   Labs on Admission: I have personally reviewed following labs and imaging studies  CBC: Recent Labs  Lab  12/16/20 1110  WBC 5.2  NEUTROABS 3.9  HGB 8.2*  HCT 26.8*  MCV 89.9  PLT 269*   Basic Metabolic Panel: Recent Labs  Lab 12/16/20 1110  NA 129*  K 2.5*  CL 85*  CO2 27  GLUCOSE 94  BUN 10  CREATININE 0.60  CALCIUM 8.0*  MG 1.6*   Liver Function Tests: Recent Labs  Lab 12/16/20 1110  AST 59*  ALT 18  ALKPHOS 84  BILITOT 1.6*  PROT 6.7  ALBUMIN 2.4*    Radiological Exams on Admission: CT CHEST ABDOMEN PELVIS W CONTRAST  Result Date: 12/16/2020 CLINICAL DATA:  Unintended weight loss with loss of appetite increased weakness over the past several weeks. EXAM: CT CHEST, ABDOMEN, AND PELVIS WITH CONTRAST TECHNIQUE: Multidetector CT imaging of the chest, abdomen and pelvis was performed following the standard protocol during bolus administration of intravenous contrast.  CONTRAST:  5mL OMNIPAQUE IOHEXOL 300 MG/ML  SOLN COMPARISON:  CT chest, abdomen, and pelvis dated September 26, 2020. FINDINGS: CT CHEST FINDINGS Cardiovascular: No significant vascular findings. Normal heart size. No pericardial effusion. No thoracic aortic aneurysm or dissection. Coronary, aortic arch, and branch vessel atherosclerotic vascular disease. No central pulmonary embolism. Mediastinum/Nodes: No enlarged mediastinal, hilar, or axillary lymph nodes. Diminutive or absent thyroid gland, unchanged. Trachea and esophagus demonstrate no significant findings. Lungs/Pleura: Unchanged 1.3 x 0.7 cm spiculated nodular density in the right upper lobe (series 3, image 25). Resolved interstitial infiltrates in the upper lobes and right middle lobe. Unchanged mild paraseptal emphysema in scattered minimally increased subpleural reticulation. No focal consolidation, pleural effusion, or pneumothorax. Musculoskeletal: No chest wall mass or suspicious bone lesions identified. New acute to subacute mild T12 superior endplate compression fracture with minimal height loss. CT ABDOMEN PELVIS FINDINGS Hepatobiliary: No focal  liver abnormality is seen. No gallstones, gallbladder wall thickening, or biliary dilatation. Pancreas: Unremarkable. No pancreatic ductal dilatation or surrounding inflammatory changes. Spleen: Normal in size without focal abnormality. Adrenals/Urinary Tract: The adrenal glands and both kidneys are unremarkable. No renal calculi or hydronephrosis. Homogeneously enhancing 2.1 x 1.4 cm right lateral bladder wall mass (series 2, image 105). Stomach/Bowel: Unchanged small hiatal hernia. Previously described possible mass of the gastric cardia represents normal folded gastric mucosa. The small bowel is unremarkable. Extensive sigmoid colonic diverticulosis again noted with chronic long segment wall thickening of the proximal and mid sigmoid colon and mild surrounding fat stranding. 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 History of prior appendectomy. Vascular/Lymphatic: Slightly increased bilobed fusiform aneurysmal dilatation of the infrarenal abdominal aorta measuring up to 3.9 cm (series 4, image 54), previously 3.8 cm (by my measurement). Visceral arteries are patent. Aortoiliac atherosclerotic vascular disease. No enlarged abdominal or pelvic lymph nodes. Reproductive: Uterus and bilateral adnexa are unremarkable. Other: Unchanged tiny fat containing umbilical hernia. No free fluid or pneumoperitoneum. Musculoskeletal: No acute or significant osseous findings. IMPRESSION: CT chest: 1. Unchanged 1.3 cm spiculated nodular density in the right upper lobe, favor apical scarring. Continued attention on follow-up is recommended. 2. New benign-appearing acute to subacute T12 superior endplate compression fracture. 3. Resolved multifocal pneumonia. 4. Aortic Atherosclerosis (ICD10-I70.0) and Emphysema (ICD10-J43.9). CT abdomen pelvis: 1. 2.1 cm right lateral bladder wall mass, concerning for primary bladder carcinoma. Urine cytology and cystoscopy are recommended for further evaluation. 2. Slightly  increased bilobed fusiform aneurysmal dilatation of the infrarenal abdominal aorta measuring up to 3.9 cm, previously 3.8 cm. Recommend follow-up every 2 years. This recommendation follows ACR consensus guidelines: White Paper of the ACR Incidental Findings Committee II on Vascular Findings. J Am Coll Radiol 2013; 10:789-794. 3. 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 Electronically Signed   By: Titus Dubin M.D.   On: 12/16/2020 13:36    EKG: None.  Assessment/Plan Principal Problem:   Hypokalemia Active Problems:   AAA (abdominal aortic aneurysm) without rupture   Hyponatremia   Bladder mass   Candidal intertrigo  Electrolyte abnormality-hypokalemia 2.5, hyponatremia- 129.  Hypomagnesemia 1.6.  Likely from poor oral intake. -Replete. - 1 L bolus given, continue N/s 100cc/hr x 20hrs  Bladder mass, gastric mass-saw Dr. Delton Coombes for gastric mass.  Plan was for EGD with Dr. Jenetta Downer which patient was unable to follow-up for.  Spouse is hoping some of this work-up can be done while she is hospitalized.  She is also supposed to follow-up for a PET/CT. -GI and oncology consult -N.p.o. midnight -  UA pending  Reduced right eye vision, ptosis-of several weeks, no other focal neurologic deficits. -Obtain head CT  Failure to thrive-with reportedly 50 pound weight loss.  Unable to eat. -Nutritional supplements as able  Candidal intertrigo-abdominal, umbilical area.  With mild purulence suggesting possible superimposed bacterial infection. -Nystatin topical powder -Doxycycline IV 100 twice daily  HTN-blood pressure soft.  Hypothyroidism - Resume Synthroid  Aortic aneurysm -Incidental finding on CT today, 12/12- slightly increased bilobed fusiform aneurysmal dilatation of the infrarenal abdominal aorta measuring up to 3.9 cm, previously 3.8 cm. Recommend follow-up every 2 years. -Problem list updated   DVT prophylaxis: Heparin Code Status:  Full code Family Communication: Spouse at bedside Disposition Plan:  ~  2 days, pending oncology and GI evaluation. Consults called: GI, oncology Admission status: Obs tele     Bethena Roys MD [facility]  12/16/2020, 8:27 PM

## 2020-12-16 NOTE — ED Notes (Signed)
Pt turned on side and repositioned per pt request.

## 2020-12-16 NOTE — ED Triage Notes (Signed)
Pt arrived via EMS with complaints of generalized weakness since September. Pt states that she is unable to keep food and pills down. Pt states that she saw her PCP and they referred her to an oncologist for possible cancer dx. Upon assessment pt has breakdown in mid abdominal area that is red with some drainage.

## 2020-12-16 NOTE — ED Notes (Signed)
Pt returned from CT °

## 2020-12-16 NOTE — ED Notes (Signed)
Patient transported to CT 

## 2020-12-16 NOTE — ED Provider Notes (Addendum)
[facility] EMERGENCY DEPARTMENT Provider Note   CSN: [id number] Arrival date & time: 12/16/20  1058     History Chief Complaint  Patient presents with   Weakness    Olivia Blanchard is a 70 y.o. female.  HPI She presents for evaluation of ongoing vomiting.  She has recently been seen and encouraged to have a possible gastric mass evaluated with EGD, and colonoscopy.  She also was referred for nuclear medicine PET scan.  None of these tests have been done.  Today she is complaining of generalized weakness for several months.  She is having trouble eating and keeping pills down.  She saw her PCP today who encouraged her to come here.  She states that she did not go for the testing, because she was "too weak."  She is here with her husband who relates that she is weak and she is eating almost nothing.  She and her husband believe that she has lost about 50 pounds     Past Medical History:  Diagnosis Date   Grave's disease    HYPERTENSION 09/30/2007   HYPOTHYROIDISM 09/30/2007   Tobacco abuse     Patient Active Problem List   Diagnosis Date Noted   Sigmoid thickening 10/30/2020   AAA (abdominal aortic aneurysm) without rupture 10/30/2020   Folate deficiency 10/23/2020   Weight loss, unintentional 10/23/2020   Mass of stomach 10/23/2020   High serum ferritin 10/23/2020   Postablative hypothyroidism 09/30/2007   Essential hypertension 09/30/2007   URI 09/30/2007    Past Surgical History:  Procedure Laterality Date   APPENDECTOMY     cataract surgery Right    cataract surgery Left      OB History   No obstetric history on file.     Family History  Problem Relation Age of Onset   Diabetes Mother    Thyroid disease Mother    Cancer Neg Hx        sister s- lung Ca, copd    Social History   Tobacco Use   Smoking status: Former    Packs/day: 1.00    Types: Cigarettes    Quit date: 09/24/2020    Years since quitting: 0.2   Smokeless tobacco: Never  Substance  Use Topics   Alcohol use: Never   Drug use: Never    Home Medications Prior to Admission medications   Medication Sig Start Date End Date Taking? Authorizing Provider  ALPRAZolam (XANAX) 0.25 MG tablet TAKE 1 TABLET BY MOUTH TWICE DAILY AS NEEDED FOR ANXIETY Patient taking differently: Take 0.25 mg by mouth 2 (two) times daily as needed for anxiety. 11/18/20  Yes Derek Jack, MD  folic acid (FOLVITE) 1 MG tablet Take 1 tablet (1 mg total) by mouth daily. 10/23/20  Yes Derek Jack, MD  levothyroxine (SYNTHROID) 112 MCG tablet TAKE 1 TABLET BY MOUTH DAILY Patient taking differently: Take 112 mcg by mouth daily at 6 (six) AM. 08/30/20  Yes Elayne Snare, MD  potassium chloride (KLOR-CON) 10 MEQ tablet Take 1 tablet PO BID x 7 days, then take 1 tablet PO daily Patient taking differently: Take 10 mEq by mouth daily. 10/02/20  Yes Koberlein, Steele Berg, MD  prochlorperazine (COMPAZINE) 10 MG tablet Take 1 tablet (10 mg total) by mouth every 6 (six) hours as needed for nausea or vomiting. 11/05/20  Yes Derek Jack, MD  famotidine (PEPCID) 20 MG tablet Take 20 mg by mouth daily. Patient not taking: Reported on 12/16/2020    [provider]  metoprolol succinate (TOPROL-XL) 50 MG 24 hr tablet Take 1 tablet (50 mg total) by mouth daily. Patient not taking: Reported on 12/16/2020 10/23/20   Caren Macadam, MD  polyethylene glycol-electrolytes (TRILYTE) 420 g solution Take 4,000 mLs by mouth as directed. Patient not taking: Reported on 12/16/2020 10/30/20   Harvel Quale, MD    Allergies    Codeine phosphate, Lidocaine, and Prednisone  Review of Systems   Review of Systems  All other systems reviewed and are negative.  Physical Exam Updated Vital Signs BP 106/67   Pulse 71   Temp 97.7 F (36.5 C) (Oral)   Resp 16   Ht 5\' 2"  (1.575 m)   Wt 62.9 kg   SpO2 100%   BMI 25.37 kg/m   Physical Exam Vitals and nursing note reviewed.   Constitutional:      Appearance: She is well-developed. She is not ill-appearing.     Comments: Elderly, under nourished appearance  HENT:     Head: Normocephalic and atraumatic.     Right Ear: External ear normal.     Left Ear: External ear normal.  Eyes:     Conjunctiva/sclera: Conjunctivae normal.     Pupils: Pupils are equal, round, and reactive to light.  Neck:     Trachea: Phonation normal.  Cardiovascular:     Rate and Rhythm: Normal rate and regular rhythm.     Heart sounds: Normal heart sounds.  Pulmonary:     Effort: Pulmonary effort is normal.     Breath sounds: Normal breath sounds.  Abdominal:     General: There is no distension.     Palpations: Abdomen is soft.     Tenderness: There is no abdominal tenderness.  Musculoskeletal:        General: Normal range of motion.     Cervical back: Normal range of motion and neck supple.  Skin:    General: Skin is warm and dry.  Neurological:     Mental Status: She is alert and oriented to person, place, and time.     Cranial Nerves: No cranial nerve deficit.     Sensory: No sensory deficit.     Motor: No abnormal muscle tone.     Coordination: Coordination normal.  Psychiatric:        Mood and Affect: Mood normal.        Behavior: Behavior normal.    ED Results / Procedures / Treatments   Labs (all labs ordered are listed, but only abnormal results are displayed) Labs Reviewed  COMPREHENSIVE METABOLIC PANEL - Abnormal; Notable for the following components:      Result Value   Sodium 129 (*)    Potassium 2.5 (*)    Chloride 85 (*)    Calcium 8.0 (*)    Albumin 2.4 (*)    AST 59 (*)    Total Bilirubin 1.6 (*)    Anion gap 17 (*)    All other components within normal limits  CBC WITH DIFFERENTIAL/PLATELET - Abnormal; Notable for the following components:   RBC 2.98 (*)    Hemoglobin 8.2 (*)    HCT 26.8 (*)    RDW 16.8 (*)    Platelets 120 (*)    All other components within normal limits  MAGNESIUM -  Abnormal; Notable for the following components:   Magnesium 1.6 (*)    All other components within normal limits  RESP PANEL BY RT-PCR (FLU A&B, COVID) ARPGX2  ETHANOL  URINALYSIS, ROUTINE W REFLEX  MICROSCOPIC    EKG None  Radiology CT CHEST ABDOMEN PELVIS W CONTRAST  Result Date: 12/16/2020 CLINICAL DATA:  Unintended weight loss with loss of appetite increased weakness over the past several weeks. EXAM: CT CHEST, ABDOMEN, AND PELVIS WITH CONTRAST TECHNIQUE: Multidetector CT imaging of the chest, abdomen and pelvis was performed following the standard protocol during bolus administration of intravenous contrast. CONTRAST:  90mL OMNIPAQUE IOHEXOL 300 MG/ML  SOLN COMPARISON:  CT chest, abdomen, and pelvis dated September 26, 2020. FINDINGS: CT CHEST FINDINGS Cardiovascular: No significant vascular findings. Normal heart size. No pericardial effusion. No thoracic aortic aneurysm or dissection. Coronary, aortic arch, and branch vessel atherosclerotic vascular disease. No central pulmonary embolism. Mediastinum/Nodes: No enlarged mediastinal, hilar, or axillary lymph nodes. Diminutive or absent thyroid gland, unchanged. Trachea and esophagus demonstrate no significant findings. Lungs/Pleura: Unchanged 1.3 x 0.7 cm spiculated nodular density in the right upper lobe (series 3, image 25). Resolved interstitial infiltrates in the upper lobes and right middle lobe. Unchanged mild paraseptal emphysema in scattered minimally increased subpleural reticulation. No focal consolidation, pleural effusion, or pneumothorax. Musculoskeletal: No chest wall mass or suspicious bone lesions identified. New acute to subacute mild T12 superior endplate compression fracture with minimal height loss. CT ABDOMEN PELVIS FINDINGS Hepatobiliary: No focal liver abnormality is seen. No gallstones, gallbladder wall thickening, or biliary dilatation. Pancreas: Unremarkable. No pancreatic ductal dilatation or surrounding inflammatory  changes. Spleen: Normal in size without focal abnormality. Adrenals/Urinary Tract: The adrenal glands and both kidneys are unremarkable. No renal calculi or hydronephrosis. Homogeneously enhancing 2.1 x 1.4 cm right lateral bladder wall mass (series 2, image 105). Stomach/Bowel: Unchanged small hiatal hernia. Previously described possible mass of the gastric cardia represents normal folded gastric mucosa. The small bowel is unremarkable. Extensive sigmoid colonic diverticulosis again noted with chronic long segment wall thickening of the proximal and mid sigmoid colon and mild surrounding fat stranding. 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 History of prior appendectomy. Vascular/Lymphatic: Slightly increased bilobed fusiform aneurysmal dilatation of the infrarenal abdominal aorta measuring up to 3.9 cm (series 4, image 54), previously 3.8 cm (by my measurement). Visceral arteries are patent. Aortoiliac atherosclerotic vascular disease. No enlarged abdominal or pelvic lymph nodes. Reproductive: Uterus and bilateral adnexa are unremarkable. Other: Unchanged tiny fat containing umbilical hernia. No free fluid or pneumoperitoneum. Musculoskeletal: No acute or significant osseous findings. IMPRESSION: CT chest: 1. Unchanged 1.3 cm spiculated nodular density in the right upper lobe, favor apical scarring. Continued attention on follow-up is recommended. 2. New benign-appearing acute to subacute T12 superior endplate compression fracture. 3. Resolved multifocal pneumonia. 4. Aortic Atherosclerosis (ICD10-I70.0) and Emphysema (ICD10-J43.9). CT abdomen pelvis: 1. 2.1 cm right lateral bladder wall mass, concerning for primary bladder carcinoma. Urine cytology and cystoscopy are recommended for further evaluation. 2. Slightly increased bilobed fusiform aneurysmal dilatation of the infrarenal abdominal aorta measuring up to 3.9 cm, previously 3.8 cm. Recommend follow-up every 2 years. This recommendation  follows ACR consensus guidelines: White Paper of the ACR Incidental Findings Committee II on Vascular Findings. J Am Coll Radiol 2013; 10:789-794. 3. 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 Electronically Signed   By: Titus Dubin M.D.   On: 12/16/2020 13:36    Procedures .Critical Care Performed by: Daleen Bo, MD Authorized by: Daleen Bo, MD   Critical care provider statement:    Critical care time (minutes):  50   Critical care start time:  12/16/2020 11:10 AM   Critical care end time:  12/16/2020 3:40  PM   Critical care time was exclusive of:  Separately billable procedures and treating other patients   Critical care was necessary to treat or prevent imminent or life-threatening deterioration of the following conditions:  Metabolic crisis   Critical care was time spent personally by me on the following activities:  Blood draw for specimens, development of treatment plan with patient or surrogate, discussions with consultants, evaluation of patient's response to treatment, examination of patient, ordering and performing treatments and interventions, ordering and review of laboratory studies, ordering and review of radiographic studies, pulse oximetry, re-evaluation of patient's condition and review of old charts   Medications Ordered in ED Medications  potassium chloride 10 mEq in 100 mL IVPB (10 mEq Intravenous New Bag/Given 12/16/20 1433)  magnesium sulfate IVPB 2 g 50 mL (2 g Intravenous New Bag/Given 12/16/20 1440)  0.9 %  sodium chloride infusion (has no administration in time range)  sodium chloride 0.9 % bolus 1,000 mL (0 mLs Intravenous Stopped 12/16/20 1520)  potassium chloride 10 mEq in 100 mL IVPB (0 mEq Intravenous Stopped 12/16/20 1400)  iohexol (OMNIPAQUE) 300 MG/ML solution 100 mL (80 mLs Intravenous Contrast Given 12/16/20 1312)    ED Course  I have reviewed the triage vital signs and the nursing notes.  Pertinent  labs & imaging results that were available during my care of the patient were reviewed by me and considered in my medical decision making (see chart for details).    MDM Rules/Calculators/A&P                            Patient Vitals for the past 24 hrs:  BP Temp Temp src Pulse Resp SpO2 Height Weight  12/16/20 1500 106/67 -- -- 71 16 100 % -- --  12/16/20 1430 111/77 -- -- 72 (!) 21 100 % -- --  12/16/20 1300 99/73 -- -- 74 14 100 % -- --  12/16/20 1150 -- -- -- -- -- -- -- 62.9 kg  12/16/20 1130 95/75 -- -- 85 17 100 % -- --  12/16/20 1110 118/72 97.7 F (36.5 C) Oral 87 (!) 22 100 % -- --  12/16/20 1106 -- -- -- -- -- -- 5\' 2"  (1.575 m) 62.9 kg     Medical Decision Making:  This patient is presenting for evaluation of general weakness with plan work up for gastric cancer, not being done at patient's request, which does require a range of treatment options, and is a complaint that involves a high risk of morbidity and mortality. The differential diagnoses include elderly female, with clinical history and laboratory testing and imaging concerning for gastric neoplasm.  She has resisted evaluations with endoscopy, and PET scanning.  She is concerned about weight loss and weakness.  She may have had progression of cancer, as well as dehydration and metabolic disorders related to poor oral intake.  She requires comprehensive evaluation.  I obtained additional historical information from husband at bedside.  Clinical Laboratory Tests Ordered, included CBC, Metabolic panel, and alcohol, magnesium, viral panel . Review indicates normal except hemoglobin low, magnesium low, sodium low, potassium low, chloride low, calcium low, albumin low, AST high, total bilirubin high. Radiologic Tests Ordered, included CT chest, abdomen and pelvis.  I independently Visualized: Radiographic images, which show possible bladder wall cancer.  No other acute thoracic or abdominal/pelvic problems   Critical  Interventions-medical evaluation, laboratory testing, IV fluids, IV and oral potassium, IV magnesium, radiography, observation and reassessment  After These Interventions, the Patient was reevaluated and was found with weakness secondary to poor intake, and possible bladder cancer.  Patient also has suspected gastric tumor which has not been adequately evaluated.  She has not followed up for testing as recommended.  She will require hospitalization for her metabolic disorder, because of the risk of decompensation including cardiac arrhythmia, and sudden death.  It would be helpful to get GI consultation, and probably oncologic evaluation as well.  CRITICAL CARE-yes Performed by: Daleen Bo  Nursing Notes Reviewed/ Care Coordinated Applicable Imaging Reviewed Interpretation of Laboratory Data incorporated into ED treatment  3:31 PM-Consult complete with hospitalist. Patient case explained and discussed.  She agrees to admit patient for further evaluation and treatment. 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

## 2020-12-17 DIAGNOSIS — R634 Abnormal weight loss: Secondary | ICD-10-CM | POA: Diagnosis not present

## 2020-12-17 DIAGNOSIS — I6523 Occlusion and stenosis of bilateral carotid arteries: Secondary | ICD-10-CM | POA: Diagnosis not present

## 2020-12-17 DIAGNOSIS — I6521 Occlusion and stenosis of right carotid artery: Secondary | ICD-10-CM | POA: Diagnosis present

## 2020-12-17 DIAGNOSIS — I6782 Cerebral ischemia: Secondary | ICD-10-CM | POA: Diagnosis not present

## 2020-12-17 DIAGNOSIS — Y842 Radiological procedure and radiotherapy as the cause of abnormal reaction of the patient, or of later complication, without mention of misadventure at the time of the procedure: Secondary | ICD-10-CM | POA: Diagnosis present

## 2020-12-17 DIAGNOSIS — I639 Cerebral infarction, unspecified: Secondary | ICD-10-CM | POA: Diagnosis not present

## 2020-12-17 DIAGNOSIS — R627 Adult failure to thrive: Secondary | ICD-10-CM | POA: Diagnosis present

## 2020-12-17 DIAGNOSIS — B372 Candidiasis of skin and nail: Secondary | ICD-10-CM | POA: Diagnosis not present

## 2020-12-17 DIAGNOSIS — R531 Weakness: Secondary | ICD-10-CM | POA: Diagnosis not present

## 2020-12-17 DIAGNOSIS — R64 Cachexia: Secondary | ICD-10-CM | POA: Diagnosis present

## 2020-12-17 DIAGNOSIS — M4854XA Collapsed vertebra, not elsewhere classified, thoracic region, initial encounter for fracture: Secondary | ICD-10-CM | POA: Diagnosis present

## 2020-12-17 DIAGNOSIS — I714 Abdominal aortic aneurysm, without rupture, unspecified: Secondary | ICD-10-CM

## 2020-12-17 DIAGNOSIS — D649 Anemia, unspecified: Secondary | ICD-10-CM

## 2020-12-17 DIAGNOSIS — I10 Essential (primary) hypertension: Secondary | ICD-10-CM | POA: Diagnosis present

## 2020-12-17 DIAGNOSIS — Z20822 Contact with and (suspected) exposure to covid-19: Secondary | ICD-10-CM | POA: Diagnosis present

## 2020-12-17 DIAGNOSIS — E871 Hypo-osmolality and hyponatremia: Secondary | ICD-10-CM | POA: Diagnosis not present

## 2020-12-17 DIAGNOSIS — K449 Diaphragmatic hernia without obstruction or gangrene: Secondary | ICD-10-CM | POA: Diagnosis present

## 2020-12-17 DIAGNOSIS — E876 Hypokalemia: Secondary | ICD-10-CM | POA: Diagnosis present

## 2020-12-17 DIAGNOSIS — R933 Abnormal findings on diagnostic imaging of other parts of digestive tract: Secondary | ICD-10-CM

## 2020-12-17 DIAGNOSIS — L0390 Cellulitis, unspecified: Secondary | ICD-10-CM | POA: Diagnosis present

## 2020-12-17 DIAGNOSIS — K3189 Other diseases of stomach and duodenum: Secondary | ICD-10-CM

## 2020-12-17 DIAGNOSIS — K2100 Gastro-esophageal reflux disease with esophagitis, without bleeding: Secondary | ICD-10-CM | POA: Diagnosis present

## 2020-12-17 DIAGNOSIS — D61818 Other pancytopenia: Secondary | ICD-10-CM | POA: Diagnosis present

## 2020-12-17 DIAGNOSIS — R110 Nausea: Secondary | ICD-10-CM

## 2020-12-17 DIAGNOSIS — C679 Malignant neoplasm of bladder, unspecified: Secondary | ICD-10-CM | POA: Diagnosis not present

## 2020-12-17 DIAGNOSIS — E43 Unspecified severe protein-calorie malnutrition: Secondary | ICD-10-CM | POA: Diagnosis present

## 2020-12-17 DIAGNOSIS — R339 Retention of urine, unspecified: Secondary | ICD-10-CM | POA: Diagnosis present

## 2020-12-17 DIAGNOSIS — N3289 Other specified disorders of bladder: Secondary | ICD-10-CM | POA: Diagnosis not present

## 2020-12-17 DIAGNOSIS — R911 Solitary pulmonary nodule: Secondary | ICD-10-CM | POA: Diagnosis present

## 2020-12-17 DIAGNOSIS — H538 Other visual disturbances: Secondary | ICD-10-CM | POA: Diagnosis not present

## 2020-12-17 DIAGNOSIS — H0520 Unspecified exophthalmos: Secondary | ICD-10-CM | POA: Diagnosis not present

## 2020-12-17 DIAGNOSIS — E860 Dehydration: Secondary | ICD-10-CM | POA: Diagnosis present

## 2020-12-17 DIAGNOSIS — Z87891 Personal history of nicotine dependence: Secondary | ICD-10-CM | POA: Diagnosis not present

## 2020-12-17 DIAGNOSIS — D494 Neoplasm of unspecified behavior of bladder: Secondary | ICD-10-CM | POA: Diagnosis present

## 2020-12-17 DIAGNOSIS — K5732 Diverticulitis of large intestine without perforation or abscess without bleeding: Secondary | ICD-10-CM | POA: Diagnosis present

## 2020-12-17 DIAGNOSIS — Z8673 Personal history of transient ischemic attack (TIA), and cerebral infarction without residual deficits: Secondary | ICD-10-CM | POA: Diagnosis not present

## 2020-12-17 DIAGNOSIS — I6340 Cerebral infarction due to embolism of unspecified cerebral artery: Secondary | ICD-10-CM | POA: Diagnosis present

## 2020-12-17 DIAGNOSIS — I7143 Infrarenal abdominal aortic aneurysm, without rupture: Secondary | ICD-10-CM | POA: Diagnosis present

## 2020-12-17 DIAGNOSIS — E890 Postprocedural hypothyroidism: Secondary | ICD-10-CM | POA: Diagnosis present

## 2020-12-17 LAB — URINALYSIS, ROUTINE W REFLEX MICROSCOPIC
Bilirubin Urine: NEGATIVE
Glucose, UA: NEGATIVE mg/dL
Ketones, ur: 20 mg/dL — AB
Leukocytes,Ua: NEGATIVE
Nitrite: NEGATIVE
Protein, ur: NEGATIVE mg/dL
Specific Gravity, Urine: >1.046 — ABNORMAL HIGH (ref 1.005–1.030)
pH: 6 (ref 5.0–8.0)

## 2020-12-17 LAB — CBC
HCT: 20.1 % — ABNORMAL LOW (ref 36.0–46.0)
Hemoglobin: 6.1 g/dL — CL (ref 12.0–15.0)
MCH: 27.6 pg (ref 26.0–34.0)
MCHC: 30.3 g/dL (ref 30.0–36.0)
MCV: 91 fL (ref 80.0–100.0)
Platelets: 79 10*3/uL — ABNORMAL LOW (ref 150–400)
RBC: 2.21 MIL/uL — ABNORMAL LOW (ref 3.87–5.11)
RDW: 16.8 % — ABNORMAL HIGH (ref 11.5–15.5)
WBC: 2.7 10*3/uL — ABNORMAL LOW (ref 4.0–10.5)
nRBC: 0 % (ref 0.0–0.2)

## 2020-12-17 LAB — HIV ANTIBODY (ROUTINE TESTING W REFLEX): HIV Screen 4th Generation wRfx: NONREACTIVE

## 2020-12-17 LAB — HEPATIC FUNCTION PANEL
ALT: 15 U/L (ref 0–44)
AST: 45 U/L — ABNORMAL HIGH (ref 15–41)
Albumin: 2 g/dL — ABNORMAL LOW (ref 3.5–5.0)
Alkaline Phosphatase: 63 U/L (ref 38–126)
Bilirubin, Direct: 0.2 mg/dL (ref 0.0–0.2)
Indirect Bilirubin: 1.1 mg/dL — ABNORMAL HIGH (ref 0.3–0.9)
Total Bilirubin: 1.3 mg/dL — ABNORMAL HIGH (ref 0.3–1.2)
Total Protein: 5.4 g/dL — ABNORMAL LOW (ref 6.5–8.1)

## 2020-12-17 LAB — BASIC METABOLIC PANEL
Anion gap: 13 (ref 5–15)
BUN: 8 mg/dL (ref 8–23)
CO2: 25 mmol/L (ref 22–32)
Calcium: 7.3 mg/dL — ABNORMAL LOW (ref 8.9–10.3)
Chloride: 95 mmol/L — ABNORMAL LOW (ref 98–111)
Creatinine, Ser: 0.47 mg/dL (ref 0.44–1.00)
GFR, Estimated: >60 mL/min (ref >60)
Glucose, Bld: 61 mg/dL — ABNORMAL LOW (ref 70–99)
Potassium: 4.1 mmol/L (ref 3.5–5.1)
Sodium: 133 mmol/L — ABNORMAL LOW (ref 135–145)

## 2020-12-17 LAB — HEMOGLOBIN AND HEMATOCRIT, BLOOD
HCT: 31.5 % — ABNORMAL LOW (ref 36.0–46.0)
Hemoglobin: 9.9 g/dL — ABNORMAL LOW (ref 12.0–15.0)

## 2020-12-17 LAB — MAGNESIUM: Magnesium: 1.9 mg/dL (ref 1.7–2.4)

## 2020-12-17 LAB — ABO/RH: ABO/RH(D): O POS

## 2020-12-17 LAB — PREPARE RBC (CROSSMATCH)

## 2020-12-17 MED ORDER — SODIUM CHLORIDE 0.9% IV SOLUTION: Freq: Once | INTRAVENOUS | Status: AC

## 2020-12-17 MED ORDER — FUROSEMIDE 10 MG/ML IJ SOLN
20.0000 mg | Freq: Once | INTRAMUSCULAR | Status: AC
  Administered 2020-12-17: 20 mg via INTRAVENOUS
  Filled 2020-12-17: qty 2

## 2020-12-17 MED ORDER — PEG 3350-KCL-NA BICARB-NACL 420 G PO SOLR
4000.0000 mL | Freq: Once | ORAL | Status: AC
  Administered 2020-12-17: 4000 mL via ORAL

## 2020-12-17 MED ORDER — PANTOPRAZOLE SODIUM 40 MG PO TBEC
40.0000 mg | DELAYED_RELEASE_TABLET | Freq: Two times a day (BID) | ORAL | Status: DC
  Administered 2020-12-17 – 2020-12-20 (×6): 40 mg via ORAL
  Filled 2020-12-17 (×6): qty 1

## 2020-12-17 MED ORDER — FOLIC ACID 1 MG PO TABS
1.0000 mg | ORAL_TABLET | Freq: Every day | ORAL | Status: DC
  Administered 2020-12-17 – 2020-12-20 (×4): 1 mg via ORAL
  Filled 2020-12-17 (×4): qty 1

## 2020-12-17 NOTE — Progress Notes (Signed)
PROGRESS NOTE    Olivia Blanchard  CNO:[id number] DOB: 12/09/1950 DOA: 12/16/2020 PCP: Caren Macadam, MD   Chief Complaint  Patient presents with   Weakness    Brief Narrative:  As per H&P written by Dr. Denton Brick controlled [place] is a 70 y.o. female with medical history significant for AAA, Graves' disease, hypertension, stomach mass. Patient presented to the ED today with complaints of generalized weakness, unable to eat, patient reports nausea but denies vomiting to me.  No abdominal pain.  Reports symptoms have been ongoing since September.  Spouse is at bedside and confirmed that patient has lost about 50 pounds since September.  She had recent CT abdomen pelvis done done at  [facility]- 09/2020, which showed wall thickening of the gastric cardia question mass.  Followed up with oncology.  She was to have EGD 11/8/ 22 and PET/CT done for staging and diagnosis of possible malignancy, but she was unable to do it.  Tells me over 4 months, vision in her right eye has declined. She also has mild droop of her right eyelid, which has been present for a while, but spouse says this is not normal.  No headaches.  No focal weakness of extremities.   ED Course: Stable vitals.  Potassium 2.5.  Magnesium 1.6.  CT Chest and abdomen in ED today shows a right lateral bladder wall mass concerning for primary bladder carcinoma.  Chronic sigmoid diverticulitis with possible colocolonic fistula. Hospitalist to admit for generalized weakness, electrolyte abnormalities.   Assessment & Plan: 1-Hypokalemia/FTT -The setting of poor oral intake and dehydration -Continue maintaining adequate fluid hydration and replete electrolytes as needed -Repeat basic metabolic panel to follow electrolytes trend.  2-anemia of chronic disease -Hemoglobin down to 6.1 -PRBC transfusion will be provided -No signs of overt bleeding appreciated -Follow hemoglobin trend. -stopping heparin  products.  3-gastric mass/4 stomach cancer -PET/CT pending -Oncology service has been consulted -Gastroenterology service also consulted for potential endoscopy evaluation -We will follow recommendations and results finding.  4-reduce right eye vision/ptosis -Patient reports symptoms have been present for several weeks -No other neurologic deficit -CT scan not demonstrating acute intracranial normalities.  5-hypertension -Blood pressure soft -Continue to follow vital signs -Holding antihypertensive agents currently.  6-hypothyroidism -Continue Synthroid.  7-aortic aneurysm -Incidental finding on a CT scan at time of admission -Outpatient follow-up with vascular surgery for reevaluation and repeat images in 2 years recommended.  8-Candidal intertrigo/cellulitis -Continue treatment with statin -continue doxycycline   DVT prophylaxis: SCDs Code Status: Full code Family Communication: Husband at bedside. Disposition:   Status is: Inpatient   Consultants:  GI Oncology service  Procedures:  See below for x-ray reports.  Antimicrobials:  None    Subjective: Chronically ill in appearance; reporting poor appetite.  Patient is weak tired and deconditioned.  No chest pain, no nausea, no vomiting, no shortness of breath.  Objective: Vitals:   12/17/20 0945 12/17/20 1215 12/17/20 1233 12/17/20 1305  BP: 126/66 122/63 125/70 (!) 92/50  Pulse: 81 79 81 76  Resp: 20 18 20 20   Temp: 98.1 F (36.7 C) 98.3 F (36.8 C) 98.3 F (36.8 C) 97.9 F (36.6 C)  TempSrc: Oral Oral Oral Oral  SpO2: 100% 100% 100% 100%  Weight:      Height:        Intake/Output Summary (Last 24 hours) at 12/17/2020 1509 Last data filed at 12/17/2020 1300 Gross per 24 hour  Intake 2304.21 ml  Output 1050 ml  Net  1254.21 ml   Filed Weights   12/16/20 1106 12/16/20 1150  Weight: 62.9 kg 62.9 kg    Examination:  General exam: Appears calm and comfortable; reports no nausea, no vomiting,  no chest pain.  Chronically ill in appearance and deconditioned.  Expressed feeling tired without appetite. Respiratory system: Clear to auscultation. Respiratory effort normal.  No using accessory muscle.  Good saturation on room air. Cardiovascular system: S1 & S2 heard, RRR. No JVD, murmurs, rubs, gallops or clicks. No pedal edema. Gastrointestinal system: Abdomen is soft, nontender on palpation with positive bowel sounds. Central nervous system: Alert and oriented. No focal neurological deficits. Extremities: No cyanosis or clubbing. Skin: No petechiae. Psychiatry: Judgement and insight appear normal. Mood & affect appropriate.    Data Reviewed: I have personally reviewed following labs and imaging studies  CBC: Recent Labs  Lab 12/16/20 1110 12/17/20 0513  WBC 5.2 2.7*  NEUTROABS 3.9  --   HGB 8.2* 6.1*  HCT 26.8* 20.1*  MCV 89.9 91.0  PLT 120* 79*    Basic Metabolic Panel: Recent Labs  Lab 12/16/20 1110 12/17/20 0513  NA 129* 133*  K 2.5* 4.1  CL 85* 95*  CO2 27 25  GLUCOSE 94 61*  BUN 10 8  CREATININE 0.60 0.47  CALCIUM 8.0* 7.3*  MG 1.6* 1.9    GFR: Estimated Creatinine Clearance: 57.8 mL/min (by C-G formula based on SCr of 0.47 mg/dL).  Liver Function Tests: Recent Labs  Lab 12/16/20 1110 12/17/20 0513  AST 59* 45*  ALT 18 15  ALKPHOS 84 63  BILITOT 1.6* 1.3*  PROT 6.7 5.4*  ALBUMIN 2.4* 2.0*    CBG: No results for input(s): GLUCAP in the last 168 hours.   Recent Results (from the past 240 hour(s))  Resp Panel by RT-PCR (Flu A&B, Covid) Nasopharyngeal Swab     Status: None   Collection Time: 12/16/20 11:50 AM   Specimen: Nasopharyngeal Swab; Nasopharyngeal(NP) swabs in vial transport medium  Result Value Ref Range Status   SARS Coronavirus 2 by RT PCR NEGATIVE NEGATIVE Final    Comment: (NOTE) SARS-CoV-2 target nucleic acids are NOT DETECTED.  The SARS-CoV-2 RNA is generally detectable in upper respiratory specimens during the acute  phase of infection. The lowest concentration of SARS-CoV-2 viral copies this assay can detect is 138 copies/mL. A negative result does not preclude SARS-Cov-2 infection and should not be used as the sole basis for treatment or other patient management decisions. A negative result may occur with  improper specimen collection/handling, submission of specimen other than nasopharyngeal swab, presence of viral mutation(s) within the areas targeted by this assay, and inadequate number of viral copies(<138 copies/mL). A negative result must be combined with clinical observations, patient history, and epidemiological information. The expected result is Negative.  Fact Sheet for Patients:  EntrepreneurPulse.com.au  Fact Sheet for Healthcare Providers:  IncredibleEmployment.be  This test is no t yet approved or cleared by the [place] FDA and  has been authorized for detection and/or diagnosis of SARS-CoV-2 by FDA under an Emergency Use Authorization (EUA). This EUA will remain  in effect (meaning this test can be used) for the duration of the COVID-19 declaration under Section 564(b)(1) of the Act, 21 U.S.C.section 360bbb-3(b)(1), unless the authorization is terminated  or revoked sooner.       Influenza A by PCR NEGATIVE NEGATIVE Final   Influenza B by PCR NEGATIVE NEGATIVE Final    Comment: (NOTE) The Xpert Xpress SARS-CoV-2/FLU/RSV plus assay is intended as  an aid in the diagnosis of influenza from Nasopharyngeal swab specimens and should not be used as a sole basis for treatment. Nasal washings and aspirates are unacceptable for Xpert Xpress SARS-CoV-2/FLU/RSV testing.  Fact Sheet for Patients: EntrepreneurPulse.com.au  Fact Sheet for Healthcare Providers: IncredibleEmployment.be  This test is not yet approved or cleared by the [place] FDA and has been authorized for detection and/or diagnosis of  SARS-CoV-2 by FDA under an Emergency Use Authorization (EUA). This EUA will remain in effect (meaning this test can be used) for the duration of the COVID-19 declaration under Section 564(b)(1) of the Act, 21 U.S.C. section 360bbb-3(b)(1), unless the authorization is terminated or revoked.  Performed at [facility], [address] [place], [place] [address]      Radiology Studies: CT HEAD WO CONTRAST (5MM)  Result Date: 12/17/2020 CLINICAL DATA:  Reduced vision in right eye EXAM: CT HEAD WITHOUT CONTRAST TECHNIQUE: Contiguous axial images were obtained from the base of the skull through the vertex without intravenous contrast. COMPARISON:  09/24/2020 FINDINGS: Brain: No evidence of acute infarction, hemorrhage, hydrocephalus, extra-axial collection or mass lesion/mass effect. Subcortical white matter and periventricular small vessel ischemic changes. Vascular: Intracranial atherosclerosis. Skull: Normal. Negative for fracture or focal lesion. Sinuses/Orbits: The visualized paranasal sinuses are essentially clear. The mastoid air cells are unopacified. Mild proptosis of the bilateral orbits, left greater than right, unchanged. Retroconal soft tissues are within normal limits. Other: None. IMPRESSION: No evidence of acute intracranial abnormality. Small vessel ischemic changes. Mild proptosis of the bilateral orbits, left greater than right, unchanged. Retroconal soft tissues are within normal limits. Electronically Signed   By: Julian Hy M.D.   On: 12/17/2020 00:35   CT CHEST ABDOMEN PELVIS W CONTRAST  Result Date: 12/16/2020 CLINICAL DATA:  Unintended weight loss with loss of appetite increased weakness over the past several weeks. EXAM: CT CHEST, ABDOMEN, AND PELVIS WITH CONTRAST TECHNIQUE: Multidetector CT imaging of the chest, abdomen and pelvis was performed following the standard protocol during bolus administration of intravenous contrast. CONTRAST:  52mL OMNIPAQUE IOHEXOL 300  MG/ML  SOLN COMPARISON:  CT chest, abdomen, and pelvis dated September 26, 2020. FINDINGS: CT CHEST FINDINGS Cardiovascular: No significant vascular findings. Normal heart size. No pericardial effusion. No thoracic aortic aneurysm or dissection. Coronary, aortic arch, and branch vessel atherosclerotic vascular disease. No central pulmonary embolism. Mediastinum/Nodes: No enlarged mediastinal, hilar, or axillary lymph nodes. Diminutive or absent thyroid gland, unchanged. Trachea and esophagus demonstrate no significant findings. Lungs/Pleura: Unchanged 1.3 x 0.7 cm spiculated nodular density in the right upper lobe (series 3, image 25). Resolved interstitial infiltrates in the upper lobes and right middle lobe. Unchanged mild paraseptal emphysema in scattered minimally increased subpleural reticulation. No focal consolidation, pleural effusion, or pneumothorax. Musculoskeletal: No chest wall mass or suspicious bone lesions identified. New acute to subacute mild T12 superior endplate compression fracture with minimal height loss. CT ABDOMEN PELVIS FINDINGS Hepatobiliary: No focal liver abnormality is seen. No gallstones, gallbladder wall thickening, or biliary dilatation. Pancreas: Unremarkable. No pancreatic ductal dilatation or surrounding inflammatory changes. Spleen: Normal in size without focal abnormality. Adrenals/Urinary Tract: The adrenal glands and both kidneys are unremarkable. No renal calculi or hydronephrosis. Homogeneously enhancing 2.1 x 1.4 cm right lateral bladder wall mass (series 2, image 105). Stomach/Bowel: Unchanged small hiatal hernia. Previously described possible mass of the gastric cardia represents normal folded gastric mucosa. The small bowel is unremarkable. Extensive sigmoid colonic diverticulosis again noted with chronic long segment wall thickening of the proximal and  mid sigmoid colon and mild surrounding fat stranding. 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  History of prior appendectomy. Vascular/Lymphatic: Slightly increased bilobed fusiform aneurysmal dilatation of the infrarenal abdominal aorta measuring up to 3.9 cm (series 4, image 54), previously 3.8 cm (by my measurement). Visceral arteries are patent. Aortoiliac atherosclerotic vascular disease. No enlarged abdominal or pelvic lymph nodes. Reproductive: Uterus and bilateral adnexa are unremarkable. Other: Unchanged tiny fat containing umbilical hernia. No free fluid or pneumoperitoneum. Musculoskeletal: No acute or significant osseous findings. IMPRESSION: CT chest: 1. Unchanged 1.3 cm spiculated nodular density in the right upper lobe, favor apical scarring. Continued attention on follow-up is recommended. 2. New benign-appearing acute to subacute T12 superior endplate compression fracture. 3. Resolved multifocal pneumonia. 4. Aortic Atherosclerosis (ICD10-I70.0) and Emphysema (ICD10-J43.9). CT abdomen pelvis: 1. 2.1 cm right lateral bladder wall mass, concerning for primary bladder carcinoma. Urine cytology and cystoscopy are recommended for further evaluation. 2. Slightly increased bilobed fusiform aneurysmal dilatation of the infrarenal abdominal aorta measuring up to 3.9 cm, previously 3.8 cm. Recommend follow-up every 2 years. This recommendation follows ACR consensus guidelines: White Paper of the ACR Incidental Findings Committee II on Vascular Findings. J Am Coll Radiol 2013; 10:789-794. 3. 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 Electronically Signed   By: Titus Dubin M.D.   On: 12/16/2020 13:36    Scheduled Meds:  folic acid  1 mg Oral Daily   levothyroxine  112 mcg Oral Q0600   nystatin  1 application Topical TID   pantoprazole  40 mg Oral BID   polyethylene glycol-electrolytes  4,000 mL Oral Once   Continuous Infusions:  sodium chloride Stopped (12/16/20 2047)   doxycycline (VIBRAMYCIN) IV Stopped (12/16/20 2149)     LOS: 0 days     Time spent: 35 minutes.    Barton Dubois, MD [facility]   To contact the attending provider between 7A-7P or the covering provider during after hours 7P-7A, please log into the web site [email or web address] and access using universal [place] password for that web site. If you do not have the password, please call the hospital operator.  12/17/2020, 3:09 PM

## 2020-12-17 NOTE — Consult Note (Signed)
@LOGO @   Referring Provider: [facility] hospitalist Primary Care Physician:  Caren Macadam, MD Primary Gastroenterologist:  Dr. Jenetta Downer  Date of Admission: 12/16/2020 Date of Consultation: 12/17/2020  Reason for Consultation: Weight loss, poor intake, prior concerns for gastric mass and sigmoid thickening.  HPI:  Olivia Blanchard is a 70 y.o. year old female medical history significant for AAA, Graves' disease s/p radiation therapy now hypothyroid, hypertension, and recently with CT in September concerning for gastric neoplasm as well as wall thickening of the sigmoid colon with minimal pericolonic inflammatory changes suspicious for diverticulitis in September 2022, CEA elevated at 6.3, had been scheduled for EGD and colonoscopy in November, but this was not completed, presenting to the emergency room due to generalized weakness, unable to eat, nausea without vomiting.   ED course: Stable vitals.   Potassium 2.5, sodium 129,magnesium 1.6, AST 59, total bilirubin 1.6, albumin 2.4.  Hemoglobin 8.2, platelets 120. UA with rare bacteria, moderate amount of hemoglobin. Respiratory panel negative. CT Chest and abdomen in ED today shows a right lateral bladder wall mass concerning for primary bladder carcinoma.  Chronic sigmoid diverticulitis with possible colocolonic fistula, unchanged.  Previously described possible mass of the gastric cardia represents normal folded gastric mucosa.  New benign-appearing acute to subacute T12 superior endplate compression fracture. CT head negative for acute abnormalities.   She received 1 L normal saline bolus and was started on 100 mL/h normal saline infusion, potassium and magnesium supplementation. On exam, she was found to have Candidal intertrigo in the umbilical area and was started on doxycycline.  Oncology was consulted due to bladder mass and prior concerns for gastric mass.  GI consulted as well.  This morning, hemoglobin is down to 6.1, platelets  79, WBC 2.7, potassium 4.1, sodium 133, magnesium 1.9.   Today:  Lack of appetite, nausea, and dry heaves without vomiting. Nausea and dry heaves are not daily. Random, not necessarily triggered by eating. Eating about 1 sandwich a day.  Reports she has lost about 50 lbs since September.  Denies abdominal pain, diarrhea, or constipation. Bowels tend to move every couple of days and can fluctuate in consistency from formed, to loose, to watery, but usually with just 1 BM per day.  Denies blood in the stools, black stools, or hematuria. Rare BC powders for HA. No other NSAIDs.   Has history of heartburn. Takes [employer] brand antiancid. Can't tell me how frequent. Seems to notice it more in the evening.  Denies dysphagia.   Worsening weakness and fatigue. Couldn't get up to go to the bathroom. Has passed 3 times in the last few weeks. No CP, palpitations, or SOB.  Just feels extremely tired.  Past Medical History:  Diagnosis Date   Grave's disease    HYPERTENSION 09/30/2007   HYPOTHYROIDISM 09/30/2007   Tobacco abuse     Past Surgical History:  Procedure Laterality Date   APPENDECTOMY     cataract surgery Right    cataract surgery Left     Prior to Admission medications   Medication Sig Start Date End Date Taking? Authorizing Provider  ALPRAZolam (XANAX) 0.25 MG tablet TAKE 1 TABLET BY MOUTH TWICE DAILY AS NEEDED FOR ANXIETY Patient taking differently: Take 0.25 mg by mouth 2 (two) times daily as needed for anxiety. 11/18/20  Yes Derek Jack, MD  folic acid (FOLVITE) 1 MG tablet Take 1 tablet (1 mg total) by mouth daily. 10/23/20  Yes Derek Jack, MD  levothyroxine (SYNTHROID) 112 MCG tablet TAKE 1 TABLET  BY MOUTH DAILY Patient taking differently: Take 112 mcg by mouth daily at 6 (six) AM. 08/30/20  Yes Elayne Snare, MD  potassium chloride (KLOR-CON) 10 MEQ tablet Take 1 tablet PO BID x 7 days, then take 1 tablet PO daily Patient taking differently: Take 10 mEq by mouth  daily. 10/02/20  Yes Koberlein, Steele Berg, MD  prochlorperazine (COMPAZINE) 10 MG tablet Take 1 tablet (10 mg total) by mouth every 6 (six) hours as needed for nausea or vomiting. 11/05/20  Yes Derek Jack, MD  famotidine (PEPCID) 20 MG tablet Take 20 mg by mouth daily. Patient not taking: Reported on 12/16/2020    [provider]  metoprolol succinate (TOPROL-XL) 50 MG 24 hr tablet Take 1 tablet (50 mg total) by mouth daily. Patient not taking: Reported on 12/16/2020 10/23/20   Caren Macadam, MD  polyethylene glycol-electrolytes (TRILYTE) 420 g solution Take 4,000 mLs by mouth as directed. Patient not taking: Reported on 12/16/2020 10/30/20   Harvel Quale, MD    Current Facility-Administered Medications  Medication Dose Route Frequency Provider Last Rate Last Admin   0.9 %  sodium chloride infusion (Manually program via Guardrails IV Fluids)   Intravenous Once Barton Dubois, MD       0.9 %  sodium chloride infusion   Intravenous Continuous Emokpae, Ejiroghene E, MD   Paused at 12/16/20 2047   acetaminophen (TYLENOL) tablet 650 mg  650 mg Oral Q6H PRN Emokpae, Ejiroghene E, MD       Or   acetaminophen (TYLENOL) suppository 650 mg  650 mg Rectal Q6H PRN Emokpae, Ejiroghene E, MD       ALPRAZolam (XANAX) tablet 0.25 mg  0.25 mg Oral BID PRN Emokpae, Ejiroghene E, MD       doxycycline (VIBRAMYCIN) 100 mg in sodium chloride 0.9 % 250 mL IVPB  100 mg Intravenous Q12H Emokpae, Ejiroghene E, MD   Stopped at 12/75/17 0017   folic acid (FOLVITE) tablet 1 mg  1 mg Oral Daily Barton Dubois, MD       furosemide (LASIX) injection 20 mg  20 mg Intravenous Once Barton Dubois, MD       levothyroxine (SYNTHROID) tablet 112 mcg  112 mcg Oral Q0600 Emokpae, Ejiroghene E, MD   112 mcg at 12/17/20 0647   nystatin (MYCOSTATIN/NYSTOP) topical powder 1 application  1 application Topical TID Emokpae, Ejiroghene E, MD   1 application at 49/44/96 2105   ondansetron (ZOFRAN) tablet 4  mg  4 mg Oral Q6H PRN Emokpae, Ejiroghene E, MD       Or   ondansetron (ZOFRAN) injection 4 mg  4 mg Intravenous Q6H PRN Emokpae, Ejiroghene E, MD       pantoprazole (PROTONIX) EC tablet 40 mg  40 mg Oral BID Barton Dubois, MD       polyethylene glycol (MIRALAX / GLYCOLAX) packet 17 g  17 g Oral Daily PRN Emokpae, Ejiroghene E, MD        Allergies as of 12/16/2020 - Review Complete 12/16/2020  Allergen Reaction Noted   Codeine phosphate     Lidocaine Palpitations 12/19/2018   Prednisone Palpitations 12/19/2018    Family History  Problem Relation Age of Onset   Diabetes Mother    Thyroid disease Mother    Cancer Neg Hx        sister s- lung Ca, copd    Social History   Socioeconomic History   Marital status: Legally Separated    Spouse name: Not on file  Number of children: Not on file   Years of education: Not on file   Highest education level: Not on file  Occupational History   Not on file  Tobacco Use   Smoking status: Former    Packs/day: 1.00    Types: Cigarettes    Quit date: 09/24/2020    Years since quitting: 0.2   Smokeless tobacco: Never  Substance and Sexual Activity   Alcohol use: Never   Drug use: Never   Sexual activity: Not on file  Other Topics Concern   Not on file  Social History Narrative   Not on file   Social Determinants of Health   Financial Resource Strain: Low Risk    Difficulty of Paying Living Expenses: Not hard at all  Food Insecurity: No Food Insecurity   Worried About Charity fundraiser in the Last Year: Never true   [place] in the Last Year: Never true  Transportation Needs: No Transportation Needs   Lack of Transportation (Medical): No   Lack of Transportation (Non-Medical): No  Physical Activity: Inactive   Days of Exercise per Week: 0 days   Minutes of Exercise per Session: 0 min  Stress: No Stress Concern Present   Feeling of Stress : Not at all  Social Connections: Moderately Isolated   Frequency of  Communication with Friends and Family: More than three times a week   Frequency of Social Gatherings with Friends and Family: More than three times a week   Attends Religious Services: Never   Marine scientist or Organizations: No   Attends Music therapist: Never   Marital Status: Married  Human resources officer Violence: Not At Risk   Fear of Current or Ex-Partner: No   Emotionally Abused: No   Physically Abused: No   Sexually Abused: No    Review of Systems: Gen: Denies fever, chills, cold or flulike symptoms. CV: Denies chest pain, heart palpitations. Resp: Denies shortness of breath. GI: See HPI GU : Denies urinary burning, urinary frequency, urinary incontinence.  MS: Denies joint pain. Derm: Admits to periumbilical rash. Psych: Denies depression, anxiety. Heme: See HPI  Physical Exam: Vital signs in last 24 hours: Temp:  [97.6 F (36.4 C)-98 F (36.7 C)] 98 F (36.7 C) (12/13 0510) Pulse Rate:  [69-87] 76 (12/13 0510) Resp:  [14-22] 18 (12/13 0510) BP: (91-118)/(58-77) 112/76 (12/13 0510) SpO2:  [100 %] 100 % (12/13 0510) Weight:  [62.9 kg] 62.9 kg (12/12 1150) Last BM Date:  (unsure) General:   Alert, no acute distress, chronically ill-appearing, very weak requiring help to maneuver in the bed.   Head:  Normocephalic and atraumatic. Eyes:  Sclera clear, no icterus.   Conjunctiva pink. Ears:  Normal auditory acuity. Lungs:  Clear throughout to auscultation.   No wheezes, crackles, or rhonchi. No acute distress. Heart:  Regular rate and rhythm; no murmurs, clicks, rubs,  or gallops. Abdomen:  Soft, nontender and nondistended. No masses, hepatosplenomegaly or hernias noted. Normal bowel sounds, without guarding, and without rebound. Erythema with foul smelling discharge in horizontal fold along umbilicus suggesting candida infection.  Rectal:  Deferred   Msk:  Symmetrical without gross deformities. Normal posture. Extremities:  Without  edema. Neurologic:  Alert and  oriented x4;  grossly normal neurologically. Skin:  Intact without significant lesions or rashes. Psych:  Normal mood and affect.  Intake/Output from previous day: 12/12 0701 - 12/13 0700 In: 1946.2 [P.O.:50; I.V.:418.7; IV Piggyback:1477.5] Out: 1050 [Urine:1050] Intake/Output this shift:  No intake/output data recorded.  Lab Results: Recent Labs    12/16/20 1110 12/17/20 0513  WBC 5.2 2.7*  HGB 8.2* 6.1*  HCT 26.8* 20.1*  PLT 120* 79*   BMET Recent Labs    12/16/20 1110 12/17/20 0513  NA 129* 133*  K 2.5* 4.1  CL 85* 95*  CO2 27 25  GLUCOSE 94 61*  BUN 10 8  CREATININE 0.60 0.47  CALCIUM 8.0* 7.3*   LFT Recent Labs    12/16/20 1110  PROT 6.7  ALBUMIN 2.4*  AST 59*  ALT 18  ALKPHOS 20  BILITOT 1.6*   Studies/Results: CT HEAD WO CONTRAST (5MM)  Result Date: 12/17/2020 CLINICAL DATA:  Reduced vision in right eye EXAM: CT HEAD WITHOUT CONTRAST TECHNIQUE: Contiguous axial images were obtained from the base of the skull through the vertex without intravenous contrast. COMPARISON:  09/24/2020 FINDINGS: Brain: No evidence of acute infarction, hemorrhage, hydrocephalus, extra-axial collection or mass lesion/mass effect. Subcortical white matter and periventricular small vessel ischemic changes. Vascular: Intracranial atherosclerosis. Skull: Normal. Negative for fracture or focal lesion. Sinuses/Orbits: The visualized paranasal sinuses are essentially clear. The mastoid air cells are unopacified. Mild proptosis of the bilateral orbits, left greater than right, unchanged. Retroconal soft tissues are within normal limits. Other: None. IMPRESSION: No evidence of acute intracranial abnormality. Small vessel ischemic changes. Mild proptosis of the bilateral orbits, left greater than right, unchanged. Retroconal soft tissues are within normal limits. Electronically Signed   By: Julian Hy M.D.   On: 12/17/2020 00:35   CT CHEST ABDOMEN PELVIS  W CONTRAST  Result Date: 12/16/2020 CLINICAL DATA:  Unintended weight loss with loss of appetite increased weakness over the past several weeks. EXAM: CT CHEST, ABDOMEN, AND PELVIS WITH CONTRAST TECHNIQUE: Multidetector CT imaging of the chest, abdomen and pelvis was performed following the standard protocol during bolus administration of intravenous contrast. CONTRAST:  49mL OMNIPAQUE IOHEXOL 300 MG/ML  SOLN COMPARISON:  CT chest, abdomen, and pelvis dated September 26, 2020. FINDINGS: CT CHEST FINDINGS Cardiovascular: No significant vascular findings. Normal heart size. No pericardial effusion. No thoracic aortic aneurysm or dissection. Coronary, aortic arch, and branch vessel atherosclerotic vascular disease. No central pulmonary embolism. Mediastinum/Nodes: No enlarged mediastinal, hilar, or axillary lymph nodes. Diminutive or absent thyroid gland, unchanged. Trachea and esophagus demonstrate no significant findings. Lungs/Pleura: Unchanged 1.3 x 0.7 cm spiculated nodular density in the right upper lobe (series 3, image 25). Resolved interstitial infiltrates in the upper lobes and right middle lobe. Unchanged mild paraseptal emphysema in scattered minimally increased subpleural reticulation. No focal consolidation, pleural effusion, or pneumothorax. Musculoskeletal: No chest wall mass or suspicious bone lesions identified. New acute to subacute mild T12 superior endplate compression fracture with minimal height loss. CT ABDOMEN PELVIS FINDINGS Hepatobiliary: No focal liver abnormality is seen. No gallstones, gallbladder wall thickening, or biliary dilatation. Pancreas: Unremarkable. No pancreatic ductal dilatation or surrounding inflammatory changes. Spleen: Normal in size without focal abnormality. Adrenals/Urinary Tract: The adrenal glands and both kidneys are unremarkable. No renal calculi or hydronephrosis. Homogeneously enhancing 2.1 x 1.4 cm right lateral bladder wall mass (series 2, image 105).  Stomach/Bowel: Unchanged small hiatal hernia. Previously described possible mass of the gastric cardia represents normal folded gastric mucosa. The small bowel is unremarkable. Extensive sigmoid colonic diverticulosis again noted with chronic long segment wall thickening of the proximal and mid sigmoid colon and mild surrounding fat stranding. 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 History of prior appendectomy. Vascular/Lymphatic: Slightly increased bilobed fusiform aneurysmal  dilatation of the infrarenal abdominal aorta measuring up to 3.9 cm (series 4, image 54), previously 3.8 cm (by my measurement). Visceral arteries are patent. Aortoiliac atherosclerotic vascular disease. No enlarged abdominal or pelvic lymph nodes. Reproductive: Uterus and bilateral adnexa are unremarkable. Other: Unchanged tiny fat containing umbilical hernia. No free fluid or pneumoperitoneum. Musculoskeletal: No acute or significant osseous findings. IMPRESSION: CT chest: 1. Unchanged 1.3 cm spiculated nodular density in the right upper lobe, favor apical scarring. Continued attention on follow-up is recommended. 2. New benign-appearing acute to subacute T12 superior endplate compression fracture. 3. Resolved multifocal pneumonia. 4. Aortic Atherosclerosis (ICD10-I70.0) and Emphysema (ICD10-J43.9). CT abdomen pelvis: 1. 2.1 cm right lateral bladder wall mass, concerning for primary bladder carcinoma. Urine cytology and cystoscopy are recommended for further evaluation. 2. Slightly increased bilobed fusiform aneurysmal dilatation of the infrarenal abdominal aorta measuring up to 3.9 cm, previously 3.8 cm. Recommend follow-up every 2 years. This recommendation follows ACR consensus guidelines: White Paper of the ACR Incidental Findings Committee II on Vascular Findings. J Am Coll Radiol 2013; 10:789-794. 3. 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 Electronically Signed   By:  Titus Dubin M.D.   On: 12/16/2020 13:36    Impression:  70 y.o. year old female medical history significant for AAA, Graves' disease s/p radiation therapy now hypothyroid, hypertension, and recently with CT in September concerning for gastric neoplasm as well as wall thickening of the sigmoid colon with minimal pericolonic inflammatory changes suspicious for diverticulitis in September 2022, CEA elevated at 6.3, had been scheduled for EGD and colonoscopy in November, but this was not completed, presenting to the emergency room due to generalized weakness, unable to eat, nausea without vomiting.  She was found to have multiple electrolyte derangements, mildly elevated AST and total bilirubin, worsening anemia, and CT concerning for new bladder mass, and redemonstrated sigmoid abnormalities, prior gastric abnormalities not appreciated.  Nausea/dry heaves/lack of appetite: With reported 50 lbs weight loss since September 2021. Previously with concerns for gastric mass on prior CT A/P without contrast at [facility] in September 2022.  CT with contrast this admission states previously described possible mass of the gastric cardia represents normal folded gastric mucosa.  She does report history of reflux, currently managing with over-the-counter antacids, but unable to give me any specifics on how frequent her symptoms are.  Denies associated abdominal pain or dysphagia.  She needs EGD for further evaluation to rule out malignancy, gastritis, duodenitis, esophagitis, PUD, H. pylori.   Abnormal CT sigmoid colon: CT this admission with chronic long segment wall thickening of proximal and mid sigmoid colon and mild surrounding fat stranding, possible colocolic fistula, unchanged in comparison to CT in September 2022 at [facility].  Notably, she denies any abdominal pain whatsoever, bright red blood per rectum, or melena.  Clinically, she does not have diverticulitis.  No prior colonoscopy.  She needs a  colonoscopy for further evaluation to rule out underlying malignancy.  Notably, CEA was elevated at 6.3 in October.   Bladder mass:  New bladder mass noted on CT this admission.  Oncology consult pending. Likely needs to see urology.   Normocytic anemia: Progressive anemia over the last few months.  Hemoglobin 10.5, 2 months ago, down to 8.2 on admission, and down to 6.1 this morning, likely influenced by hemodilution.  Previously with low folate, B12 within normal limits, iron panel with elevated ferritin, iron, and saturation ratios.  She denies overt GI bleeding or hematuria though she was found  to have moderate hemoglobin on urinalysis.  Findings on recent CT is concerning for underlying bladder mass, chronic sigmoid wall thickening with elevated CEA, and previously with concerns for gastric mass on CT in September though not appreciated on CT this admission. She needs colonoscopy and EGD for further evaluation.  Oncology consult also pending.  2 units PRBCs has been ordered.   Plan: Agree with transfusing 2 unit PRBCs.  Plan for EGD and colonoscopy with propofol with Dr. Jenetta Downer tomorrow pending hemoglobin improvement following PRBC transfusion today. Clear liquid diet today.  N.p.o. at midnight. Continue PPI twice daily. Continue folic acid. Agree with oncology consult.  Likely needs to see urology, possibly outpatient. Will defer to hospitalist.    LOS: 0 days    12/17/2020, 7:36 AM   Aliene Altes, PA-C [facility]

## 2020-12-17 NOTE — Progress Notes (Signed)
Patient has drank 1/3 of prep but stated that she is unable to drink anymore. She stated it was making her sick on her stomach and she was spitting up.  Consent form obtained.

## 2020-12-18 ENCOUNTER — Inpatient Hospital Stay (HOSPITAL_COMMUNITY): Payer: PPO | Admitting: Anesthesiology

## 2020-12-18 ENCOUNTER — Inpatient Hospital Stay (HOSPITAL_COMMUNITY): Payer: PPO

## 2020-12-18 ENCOUNTER — Encounter (HOSPITAL_COMMUNITY): Payer: Self-pay | Admitting: Internal Medicine

## 2020-12-18 ENCOUNTER — Encounter (HOSPITAL_COMMUNITY)
Admission: EM | Disposition: A | Discharge status: Hospice | Payer: Self-pay | Source: Home / Self Care | Attending: Internal Medicine

## 2020-12-18 DIAGNOSIS — K449 Diaphragmatic hernia without obstruction or gangrene: Secondary | ICD-10-CM

## 2020-12-18 DIAGNOSIS — K209 Esophagitis, unspecified without bleeding: Secondary | ICD-10-CM

## 2020-12-18 DIAGNOSIS — D61818 Other pancytopenia: Secondary | ICD-10-CM

## 2020-12-18 DIAGNOSIS — C679 Malignant neoplasm of bladder, unspecified: Secondary | ICD-10-CM

## 2020-12-18 DIAGNOSIS — E43 Unspecified severe protein-calorie malnutrition: Secondary | ICD-10-CM | POA: Insufficient documentation

## 2020-12-18 HISTORY — PX: ESOPHAGOGASTRODUODENOSCOPY (EGD) WITH PROPOFOL: SHX5813

## 2020-12-18 HISTORY — PX: BIOPSY: SHX5522

## 2020-12-18 LAB — IRON AND TIBC
Iron: 123 ug/dL (ref 28–170)
Saturation Ratios: 97 % — ABNORMAL HIGH (ref 10.4–31.8)
TIBC: 126 ug/dL — ABNORMAL LOW (ref 250–450)
UIBC: 3 ug/dL

## 2020-12-18 LAB — BPAM RBC
Blood Product Expiration Date: 202301102359
Blood Product Expiration Date: 202301162359
ISSUE DATE / TIME: 202212130918
ISSUE DATE / TIME: 202212131243
Unit Type and Rh: 5100
Unit Type and Rh: 5100

## 2020-12-18 LAB — BASIC METABOLIC PANEL
Anion gap: 14 (ref 5–15)
BUN: 6 mg/dL — ABNORMAL LOW (ref 8–23)
CO2: 25 mmol/L (ref 22–32)
Calcium: 7.1 mg/dL — ABNORMAL LOW (ref 8.9–10.3)
Chloride: 94 mmol/L — ABNORMAL LOW (ref 98–111)
Creatinine, Ser: 0.45 mg/dL (ref 0.44–1.00)
GFR, Estimated: >60 mL/min (ref >60)
Glucose, Bld: 55 mg/dL — ABNORMAL LOW (ref 70–99)
Potassium: 3.4 mmol/L — ABNORMAL LOW (ref 3.5–5.1)
Sodium: 133 mmol/L — ABNORMAL LOW (ref 135–145)

## 2020-12-18 LAB — TYPE AND SCREEN
ABO/RH(D): O POS
Antibody Screen: NEGATIVE
Unit division: 0
Unit division: 0

## 2020-12-18 LAB — FOLATE: Folate: 10.7 ng/mL (ref >5.9)

## 2020-12-18 LAB — VITAMIN B12: Vitamin B-12: 708 pg/mL (ref 180–914)

## 2020-12-18 LAB — TSH: TSH: 0.994 u[IU]/mL (ref 0.350–4.500)

## 2020-12-18 LAB — CBC
HCT: 32 % — ABNORMAL LOW (ref 36.0–46.0)
Hemoglobin: 10.2 g/dL — ABNORMAL LOW (ref 12.0–15.0)
MCH: 27.9 pg (ref 26.0–34.0)
MCHC: 31.9 g/dL (ref 30.0–36.0)
MCV: 87.7 fL (ref 80.0–100.0)
Platelets: 90 10*3/uL — ABNORMAL LOW (ref 150–400)
RBC: 3.65 MIL/uL — ABNORMAL LOW (ref 3.87–5.11)
RDW: 16 % — ABNORMAL HIGH (ref 11.5–15.5)
WBC: 4 10*3/uL (ref 4.0–10.5)
nRBC: 0 % (ref 0.0–0.2)

## 2020-12-18 LAB — FERRITIN: Ferritin: 548 ng/mL — ABNORMAL HIGH (ref 11–307)

## 2020-12-18 LAB — GLUCOSE, CAPILLARY
Glucose-Capillary: 68 mg/dL — ABNORMAL LOW (ref 70–99)
Glucose-Capillary: 145 mg/dL — ABNORMAL HIGH (ref 70–99)

## 2020-12-18 SURGERY — ESOPHAGOGASTRODUODENOSCOPY (EGD) WITH PROPOFOL
Anesthesia: General

## 2020-12-18 MED ORDER — SUCRALFATE 1 GM/10ML PO SUSP
1.0000 g | Freq: Three times a day (TID) | ORAL | Status: DC
  Administered 2020-12-18 – 2020-12-20 (×7): 1 g via ORAL
  Filled 2020-12-18 (×7): qty 10

## 2020-12-18 MED ORDER — LACTATED RINGERS IV SOLN: INTRAVENOUS | Status: DC

## 2020-12-18 MED ORDER — SODIUM CHLORIDE 0.9 % IV SOLN: INTRAVENOUS | Status: DC

## 2020-12-18 MED ORDER — ADULT MULTIVITAMIN W/MINERALS CH
1.0000 | ORAL_TABLET | Freq: Every day | ORAL | Status: DC
  Administered 2020-12-18 – 2020-12-20 (×3): 1 via ORAL
  Filled 2020-12-18 (×3): qty 1

## 2020-12-18 MED ORDER — DEXTROSE 50 % IV SOLN
INTRAVENOUS | Status: AC
  Filled 2020-12-18: qty 50

## 2020-12-18 MED ORDER — BOOST / RESOURCE BREEZE PO LIQD CUSTOM
1.0000 | Freq: Three times a day (TID) | ORAL | Status: DC
  Administered 2020-12-18 – 2020-12-19 (×2): 1 via ORAL

## 2020-12-18 MED ORDER — PROPOFOL 10 MG/ML IV BOLUS
INTRAVENOUS | Status: DC | PRN
  Administered 2020-12-18 (×2): 20 mg via INTRAVENOUS
  Administered 2020-12-18: 40 mg via INTRAVENOUS

## 2020-12-18 MED ORDER — DEXTROSE 50 % IV SOLN
50.0000 mL | Freq: Once | INTRAVENOUS | Status: AC
  Administered 2020-12-18: 11:00:00 50 mL via INTRAVENOUS

## 2020-12-18 NOTE — Progress Notes (Signed)
Initial Nutrition Assessment  DOCUMENTATION CODES:   Severe malnutrition in context of chronic illness  INTERVENTION:  Boost Breeze po TID, each supplement provides 250 kcal and 9 grams of protein   Soft diet and snacks ad lib  NUTRITION DIAGNOSIS:   Severe Malnutrition related to chronic illness as evidenced by 24% percent weight loss x 1 year, energy intake < 75% for > or equal to 1 month, moderate fat depletion, severe fat depletion, moderate muscle depletion, severe muscle depletion.   GOAL:  Patient will meet greater than or equal to 90% of their needs   MONITOR:  Supplement acceptance, PO intake, Weight trends, Labs  REASON FOR ASSESSMENT:   Malnutrition Screening Tool    ASSESSMENT: Patient complaining of weakness since September and poor oral intake. Reports suspected wt loss of ~ 50 lb. Abdominal mass. Hx of grave's disease.   Patient out of room for procedure this morning - EGD. Post -op diagnosis: hiatal hernia, esophagitis, gastric nodules. Biopsies obtained -pathology pending. Oncology consult pending. Follow for plan of care.   Patient NPO this morning for procedure. Diet now advanced to Soft for lunch and tray has not arrived at this time. Patient reporting decreased overall appetite and intake for several months.   Patient is agreeable to try Boost Breeze. RD provided for her to taste.  She doesn't like milk based ONS. Unclear at this time if pt will be able to maintain sufficient intake orally and/or if she will be a candidate for enteral feeding.  Patient weight history reviewed.  12/08/2016- 85.5 kg 12/08/2017- 81.8 kg 12/3 2020- 79.9 kg 12/08/2019- 82.8 kg 10/02/2020 -68 kg 10/30/2020- 63.1 kg 12/16/2020- 62.9 kg  Weight loss of 5 kg/ 7% x 3 months. An overall  24% / 20 kg loss x 12 months which is significant.   Medications: folic acid, protonix.   IV-Lactated Ringers @ 50 ml/hr.  Labs: BMP Latest Ref Rng & Units 12/18/2020 12/17/2020 12/16/2020   Glucose 70 - 99 mg/dL 55(L) 61(L) 94  BUN 8 - 23 mg/dL 6(L) 8 10  Creatinine 0.44 - 1.00 mg/dL 0.45 0.47 0.60  Sodium 135 - 145 mmol/L 133(L) 133(L) 129(L)  Potassium 3.5 - 5.1 mmol/L 3.4(L) 4.1 2.5(LL)  Chloride 98 - 111 mmol/L 94(L) 95(L) 85(L)  CO2 22 - 32 mmol/L 25 25 27   Calcium 8.9 - 10.3 mg/dL 7.1(L) 7.3(L) 8.0(L)      NUTRITION - FOCUSED PHYSICAL EXAM:  Flowsheet Row Most Recent Value  Orbital Region Moderate depletion  Upper Arm Region Severe depletion  Thoracic and Lumbar Region Mild depletion  Buccal Region Moderate depletion  Temple Region Moderate depletion  Clavicle Bone Region Severe depletion  Clavicle and Acromion Bone Region Moderate depletion  Dorsal Hand Severe depletion  Patellar Region Mild depletion  Anterior Thigh Region No depletion  Posterior Calf Region No depletion  Edema (RD Assessment) None  Hair Reviewed  Eyes Reviewed  Mouth Reviewed  Skin Reviewed  Nails Reviewed       Diet Order:   Diet Order             DIET SOFT Room service appropriate? Yes; Fluid consistency: Thin  Diet effective now                   EDUCATION NEEDS:  Not appropriate for education at this time  Skin:  Skin Assessment: Reviewed RN Assessment (non-pressure wound to abdomen)  Last BM:  12/14 type 7  Height:   Ht Readings from Last  1 Encounters:  12/16/20 5\' 2"  (1.575 m)    Weight:   Wt Readings from Last 1 Encounters:  12/16/20 62.9 kg    Ideal Body Weight:   50 kg  BMI:  Body mass index is 25.37 kg/m.  Estimated Nutritional Needs:   Kcal:  1900-2000  Protein:  77-85 gr  Fluid:  1800 ml daily   Colman Cater MS,RD,CSG,LDN Contact: Shea Evans

## 2020-12-18 NOTE — Op Note (Addendum)
[facility] Patient Name: Olivia Blanchard Procedure Date: 12/18/2020 11:12 AM MRN: [id number] Date of Birth: [birth year] Attending MD: Maylon Peppers ,  CSN: [id number] Age: 70 Admit Type: Inpatient Procedure:                Upper GI endoscopy Indications:              Abnormal CT of the GI tract, Nausea, Weight loss Providers:                Maylon Peppers, Lurline Del, RN, Kristine L. Risa Grill, Technician Referring MD:              Medicines:                Monitored Anesthesia Care Complications:            No immediate complications. Estimated Blood Loss:     Estimated blood loss: none. Procedure:                Pre-Anesthesia Assessment:                           - Prior to the procedure, a History and Physical                            was performed, and patient medications, allergies                            and sensitivities were reviewed. The patient's                            tolerance of previous anesthesia was reviewed.                           - The risks and benefits of the procedure and the                            sedation options and risks were discussed with the                            patient. All questions were answered and informed                            consent was obtained.                           - ASA Grade Assessment: III - A patient with severe                            systemic disease.                           After obtaining informed consent, the endoscope was                            passed under  direct vision. Throughout the                            procedure, the patient's blood pressure, pulse, and                            oxygen saturations were monitored continuously. The                            [id number]-[id number] ([id number]) scope was introduced through the                            mouth, and advanced to the second part of duodenum.                            The upper GI endoscopy was  accomplished without                            difficulty. The patient tolerated the procedure                            well. Scope In: 11:24:25 AM Scope Out: 11:35:32 AM Total Procedure Duration: 0 hours 11 minutes 7 seconds  Findings:      LA Grade C (one or more mucosal breaks continuous between tops of 2 or       more mucosal folds, less than 75% circumference) esophagitis with no       bleeding was found 31 to 34 cm from the incisors. Biopsies were taken       with a cold forceps for histology at the GE junction area as this area       looked slightly nodular and had erythematous appearance.      A 2 cm hiatal hernia was present.      Localized erythematous mucosa without bleeding was found in the stomach       in the antrum - this had a subttle nodular appearance and was focal in       different areas of the stomach. Biopsies from body, antrum and the       nodular areas were taken with a cold forceps for Helicobacter pylori       testing and histology. Upon careful inspection, no presence of hematin       or active bleeding was present, no masses were seen in the gastric       cavity - there was adequate insufflation.      The examined duodenum was normal. Upon careful inspection, no presence       of hematin or active bleeding was present. Impression:               - LA Grade C esophagitis with no bleeding. GE                            junction biopsied given mild nodularity.                           - 2 cm hiatal hernia.                           -  Erythematous mucosa (slightly nodular) in the                            stomach. Biopsied.                           - Normal examined duodenum. Moderate Sedation:      Per Anesthesia Care Recommendation:           - Return patient to hospital ward for ongoing care.                           - Advance diet as tolerated.                           - Await pathology results.                           - Continue pantoprazole 40  mg twice a day.                           - Start sucralfate 1 g QID.                           - Repeat upper endoscopy in 3 months for                            surveillance.                           - Outpatient colonoscopy. Procedure Code(s):        --- Professional ---                           [phone], Esophagogastroduodenoscopy, flexible,                            transoral; with biopsy, single or multiple Diagnosis Code(s):        --- Professional ---                           K20.90, Esophagitis, unspecified without bleeding                           K44.9, Diaphragmatic hernia without obstruction or                            gangrene                           K31.89, Other diseases of stomach and duodenum                           R11.0, Nausea                           R63.4, Abnormal weight loss  R93.3, Abnormal findings on diagnostic imaging of                            other parts of digestive tract CPT copyright 2019 [employer]. All rights reserved. The codes documented in this report are preliminary and upon coder review may  be revised to meet current compliance requirements. Maylon Peppers, MD Maylon Peppers,  12/18/2020 11:46:31 AM This report has been signed electronically. Number of Addenda: 0

## 2020-12-18 NOTE — Progress Notes (Signed)
No voiding today. Scan showed greater than 600.  Received order for in and out and yielded 600.

## 2020-12-18 NOTE — Progress Notes (Signed)
°  Transition of Care [facility]) Screening Note   Patient Details  Name: Olivia Blanchard Date of Birth: [birth year]   Transition of Care [facility]) CM/SW Contact:    Shade Flood, LCSW Phone Number: 12/18/2020, 12:19 PM    Transition of Care Department [facility]) has reviewed patient and no TOC needs have been identified at this time. We will continue to monitor patient advancement through interdisciplinary progression rounds. If new patient transition needs arise, please place a TOC consult.

## 2020-12-18 NOTE — Anesthesia Preprocedure Evaluation (Signed)
Anesthesia Evaluation  Patient identified by MRN, date of birth, ID band Patient awake    Reviewed: Allergy & Precautions, H&P , NPO status , Patient's Chart, lab work & pertinent test results, reviewed documented beta blocker date and time   Airway Mallampati: II  TM Distance: >3 FB Neck ROM: Full    Dental  (+) Dental Advisory Given, Missing   Pulmonary neg pulmonary ROS, former smoker,    Pulmonary exam normal breath sounds clear to auscultation       Cardiovascular Exercise Tolerance: Poor hypertension, Pt. on medications and Pt. on home beta blockers Normal cardiovascular exam Rhythm:Regular Rate:Normal     Neuro/Psych negative neurological ROS  negative psych ROS   GI/Hepatic Neg liver ROS, neg GERD  ,Upper GI bleeding?   Endo/Other  Hypothyroidism   Renal/GU negative Renal ROS  negative genitourinary   Musculoskeletal negative musculoskeletal ROS (+)   Abdominal   Peds negative pediatric ROS (+)  Hematology  (+) Blood dyscrasia (thrombocytopenia ), anemia ,   Anesthesia Other Findings IMPRESSION: CT chest:  1. Unchanged 1.3 cm spiculated nodular density in the right upper lobe, favor apical scarring. Continued attention on follow-up is recommended. 2. New benign-appearing acute to subacute T12 superior endplate compression fracture. 3. Resolved multifocal pneumonia. 4. Aortic Atherosclerosis (ICD10-I70.0) and Emphysema (ICD10-J43.9).  CT abdomen pelvis:  1. 2.1 cm right lateral bladder wall mass, concerning for primary bladder carcinoma. Urine cytology and cystoscopy are recommended for further evaluation. 2. Slightly increased bilobed fusiform aneurysmal dilatation of the infrarenal abdominal aorta measuring up to 3.9 cm, previously 3.8 cm. Recommend follow-up every 2 years. This recommendation follows ACR consensus guidelines: White Paper of the ACR Incidental Findings Committee II on  Vascular Findings. J Am Coll Radiol 2013; 10:789-794. 3. 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   Electronically Signed   By: Titus Dubin M.D.   On: 12/16/2020 13:36   Reproductive/Obstetrics negative OB ROS                           Anesthesia Physical Anesthesia Plan  ASA: 4  Anesthesia Plan: General   Post-op Pain Management: Minimal or no pain anticipated   Induction: Intravenous  PONV Risk Score and Plan: TIVA  Airway Management Planned: Nasal Cannula and Natural Airway  Additional Equipment:   Intra-op Plan:   Post-operative Plan: Possible Post-op intubation/ventilation  Informed Consent: I have reviewed the patients History and Physical, chart, labs and discussed the procedure including the risks, benefits and alternatives for the proposed anesthesia with the patient or authorized representative who has indicated his/her understanding and acceptance.     Dental advisory given  Plan Discussed with: CRNA and Surgeon  Anesthesia Plan Comments:        Anesthesia Quick Evaluation

## 2020-12-18 NOTE — Progress Notes (Signed)
We will proceed with EGD as scheduled.  I thoroughly discussed with the patient his procedure, including the risks involved. Patient understands what the procedure involves including the benefits and any risks. Patient understands alternatives to the proposed procedure. Risks including (but not limited to) bleeding, tearing of the lining (perforation), rupture of adjacent organs, problems with heart and lung function, infection, and medication reactions. A small percentage of complications may require surgery, hospitalization, repeat endoscopic procedure, and/or transfusion.  Patient understood and agreed.  Colonoscopy will not be performed as the patient only drank less than 1 cup of the bowel prep yesterday and did not move her bowels.  Maylon Peppers, MD Gastroenterology and Hepatology [facility]

## 2020-12-18 NOTE — Transfer of Care (Signed)
Immediate Anesthesia Transfer of Care Note  Patient: DURU REIGER  Procedure(s) Performed: ESOPHAGOGASTRODUODENOSCOPY (EGD) WITH PROPOFOL BIOPSY  Patient Location: PACU  Anesthesia Type:General  Level of Consciousness: awake, alert  and patient cooperative  Airway & Oxygen Therapy: Patient Spontanous Breathing  Post-op Assessment: Report given to RN, Post -op Vital signs reviewed and stable and Patient moving all extremities X 4  Post vital signs: Reviewed and stable  Last Vitals:  Vitals Value Taken Time  BP    Temp    Pulse 79 12/18/20 1140  Resp 22 12/18/20 1140  SpO2 100 % 12/18/20 1140  Vitals shown include unvalidated device data.  Last Pain:  Vitals:   12/18/20 1124  TempSrc:   PainSc: 0-No pain         Complications: No notable events documented.

## 2020-12-18 NOTE — Progress Notes (Signed)
Patient woke up confused wanting to know why she did not have a bowel movement last night. I explained to her that she refused the drink that was supposed to make her have bowel movements. She became very angry and stated that she should have still had a bowel movement and that she did want the test anyway. She pulled out her IV and proceeded to verbally be aggressive again.

## 2020-12-18 NOTE — Progress Notes (Signed)
Has not voided today.  Scan showed over 600.  Received order for in and out.  Bernie Covey

## 2020-12-18 NOTE — Brief Op Note (Signed)
12/16/2020 - 12/18/2020  11:47 AM  PATIENT:  Olivia Blanchard  70 y.o. female  PRE-OPERATIVE DIAGNOSIS:  gastric mass, evaluation of possible colocolonic fistula  POST-OPERATIVE DIAGNOSIS:  hiatal hernia 34-36; esophagitis  34-31; gastric nodules;  PROCEDURE:  Procedure(s): ESOPHAGOGASTRODUODENOSCOPY (EGD) WITH PROPOFOL (N/A) BIOPSY  SURGEON:  Surgeon(s) and Role:    * Harvel Quale, MD - Primary  Patient underwent EGD under propofol sedation.  Tolerated the procedure adequately. Found to have LA Grade C (one or more mucosal breaks continuous between tops of 2 or more mucosal folds, less than 75% circumference) esophagitis with no bleeding was found 31 to 34 cm from the incisors.  Biopsies were taken with a cold forceps for histology at the GE junction area as this area looked slightly nodular and had erythematous appearance. A 2 cm hiatal hernia was present.  Localized erythematous mucosa without bleeding was found in the stomach in the antrum - this had a subttle nodular appearance and was focal in different areas of the stomach.  Biopsies from body, antrum and the nodular areas were taken with a cold forceps for Helicobacter pylori testing and histology.  Upon careful inspection, no presence of hematin or active bleeding was present, no masses were seen in the gastric cavity - there was adequate insufflation. Normal duodenum.  RECOMMENDATIONS: - Return patient to hospital ward for ongoing care.  - Advance diet as tolerated.  - Await pathology results.  - Continue pantoprazole 40 mg twice a day. - Start sucralfate 1 g QID. - Repeat upper endoscopy in 3 months for surveillance. - Outpatient colonoscopy.   Maylon Peppers, MD Gastroenterology and Hepatology [facility]

## 2020-12-18 NOTE — Progress Notes (Signed)
-          PROGRESS NOTE  Olivia Blanchard KNL:[id number] DOB: 1950/11/21 DOA: 12/16/2020 PCP: Caren Macadam, MD  Brief History:  70 year old female with a history of Graves' disease status postradiation,, tension, tobacco abuse, abdominal aortic aneurysm presenting with 2 to 3 months of generalized weakness, decreased oral intake, weight loss, and nausea. She denies any fevers, chills, chest pain, abdominal pain, diarrhea, hematochezia, melena.  She has had increasing generalized weakness and fatigue.  She has had a 50 pound weight loss in September 2022.  In the ED, she was hemodynamically stable.  Labs on admission were remarkable for low potassium 2.5, sodium 129, chloride 85, mildly elevated AST of 59 with normal ALT of 18, albumin was low 2.4, total bili was mildly elevated 1.6, alkaline phosphatase was normal at 84, CBC showed a hemoglobin of 8.2 we will also count 5.2 and platelets of 120.  Repeat hemoglobin today dropped down to 6.1.  Patient is currently receiving 1 unit of PRBC.  CT of the chest, abdomen and pelvis with IV contrast showed a 2.1 cm bladder mass concerning for a bladder carcinoma and presence of a spiculated nodule in the right upper lobe due to possible scarring.  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  Patient had an elevated CEA of 6.3 in the past.  She was referred to my office for evaluation with an EGD and a colonoscopy as she was found to have thickening of the sigmoid colon and questionable gastric cardia mass on a CT scan performed at [facility] in September 2022.  However, she did not proceed with the EGD and colonoscopy as she was "fearful of the procedures".  The previously seen gastric mass was considered to be possibly a gastric fold.  The patient had a previously scheduled EGD and colonoscopy on 11/12/2020, but she did not want to proceed with testing at that time.  Assessment/Plan: Symptomatic  anemia -Presented with hemoglobin 6.1 -2 units PRBC transfused -Monitor hemoglobin -Repeat anemia panel  Pancytopenia -Normal spleen on CT abdomen -X90 -Folic acid -TSH  Ptosis/pupillary defect -MR brain  Abnormal gastric thickening/Gastritis -Appreciate GI follow-up -1214 EGD--grade C esophagitis, erythematous nodular because of the stomach, hiatus hernia -protonix and sulcrafate  Bladder Mass -urology, Dr. Alyson Ingles, consulted  Urine retention -600 cc on bladder scan on 12/14>>in/out cath  Failure to thrive -Work-up as above -PT evaluation  RUL lung nodule -Outpatient PET scan  Severe protein calorie malnutrition -Continue supplements  Hypokalemia -Replete -Check magnesium--1.9  Hyponatremia -Multifactorial including poor solute intake, volume depletion, and possibly SIADH -Continue IV normal saline for now  Abdominal aortic aneurysm -Measuring 3.9 cm infrarenal -Outpatient surveillance  Candidal intertrigo/cellulitis -Continue treatment with nystatin -continue doxycycline        Family Communication:  no Family at bedside  Consultants:    Code Status:  FULL / DNR  DVT Prophylaxis:  [place] Heparin / [facility] Lovenox   Procedures: As Listed in Progress Note Above  Antibiotics: Doxy 12/12>>      Subjective: Patient denies fevers, chills, headache, chest pain, dyspnea, nausea, vomiting, diarrhea, abdominal pain, dysuria, hematuria, hematochezia, and melena.   Objective: Vitals:   12/18/20 1140 12/18/20 1145 12/18/20 1200 12/18/20 1238  BP: (!) 113/57 111/76 111/70 119/77  Pulse: 79 80  80  Resp: (!) 22 (!) 28  17  Temp: 98 F (36.7 C)  98 F (36.7 C) 97.7 F (36.5 C)  TempSrc:  SpO2: 100% 100%  100%  Weight:      Height:        Intake/Output Summary (Last 24 hours) at 12/18/2020 1459 Last data filed at 12/18/2020 1400 Gross per 24 hour  Intake 1058 ml  Output 1700 ml  Net -642 ml   Weight change:  Exam:  General:  Pt is  alert, follows commands appropriately, not in acute distress HEENT: No icterus, No thrush, No neck mass, [place]/AT Cardiovascular: RRR, S1/S2, no rubs, no gallops Respiratory: diminished BS.  Bibasilar crackles. Abdomen: Soft/+BS, non tender, non distended, no guarding Extremities: No edema, No lymphangitis, No petechiae, No rashes, no synovitis   Data Reviewed: I have personally reviewed following labs and imaging studies Basic Metabolic Panel: Recent Labs  Lab 12/16/20 1110 12/17/20 0513 12/18/20 0456  NA 129* 133* 133*  K 2.5* 4.1 3.4*  CL 85* 95* 94*  CO2 27 25 25   GLUCOSE 94 61* 55*  BUN 10 8 6*  CREATININE 0.60 0.47 0.45  CALCIUM 8.0* 7.3* 7.1*  MG 1.6* 1.9  --    Liver Function Tests: Recent Labs  Lab 12/16/20 1110 12/17/20 0513  AST 59* 45*  ALT 18 15  ALKPHOS 84 63  BILITOT 1.6* 1.3*  PROT 6.7 5.4*  ALBUMIN 2.4* 2.0*   No results for input(s): LIPASE, AMYLASE in the last 168 hours. No results for input(s): AMMONIA in the last 168 hours. Coagulation Profile: No results for input(s): INR, PROTIME in the last 168 hours. CBC: Recent Labs  Lab 12/16/20 1110 12/17/20 0513 12/17/20 1655 12/18/20 0456  WBC 5.2 2.7*  --  4.0  NEUTROABS 3.9  --   --   --   HGB 8.2* 6.1* 9.9* 10.2*  HCT 26.8* 20.1* 31.5* 32.0*  MCV 89.9 91.0  --  87.7  PLT 120* 79*  --  90*   Cardiac Enzymes: No results for input(s): CKTOTAL, CKMB, CKMBINDEX, TROPONINI in the last 168 hours. BNP: Invalid input(s): POCBNP CBG: Recent Labs  Lab 12/18/20 1048 12/18/20 1157  GLUCAP 68* 145*   HbA1C: No results for input(s): HGBA1C in the last 72 hours. Urine analysis:    Component Value Date/Time   COLORURINE YELLOW 12/16/2020 0000   APPEARANCEUR CLEAR 12/16/2020 0000   LABSPEC >1.046 (H) 12/16/2020 0000   PHURINE 6.0 12/16/2020 0000   GLUCOSEU NEGATIVE 12/16/2020 0000   GLUCOSEU NEGATIVE 09/28/2012 1026   HGBUR MODERATE (A) 12/16/2020 0000   BILIRUBINUR NEGATIVE 12/16/2020 0000    KETONESUR 20 (A) 12/16/2020 0000   PROTEINUR NEGATIVE 12/16/2020 0000   UROBILINOGEN 0.2 09/28/2012 1026   NITRITE NEGATIVE 12/16/2020 0000   LEUKOCYTESUR NEGATIVE 12/16/2020 0000   Sepsis Labs: @LABRCNTIP (procalcitonin:4,lacticidven:4) ) Recent Results (from the past 240 hour(s))  Resp Panel by RT-PCR (Flu A&B, Covid) Nasopharyngeal Swab     Status: None   Collection Time: 12/16/20 11:50 AM   Specimen: Nasopharyngeal Swab; Nasopharyngeal(NP) swabs in vial transport medium  Result Value Ref Range Status   SARS Coronavirus 2 by RT PCR NEGATIVE NEGATIVE Final    Comment: (NOTE) SARS-CoV-2 target nucleic acids are NOT DETECTED.  The SARS-CoV-2 RNA is generally detectable in upper respiratory specimens during the acute phase of infection. The lowest concentration of SARS-CoV-2 viral copies this assay can detect is 138 copies/mL. A negative result does not preclude SARS-Cov-2 infection and should not be used as the sole basis for treatment or other patient management decisions. A negative result may occur with  improper specimen collection/handling, submission of specimen other  than nasopharyngeal swab, presence of viral mutation(s) within the areas targeted by this assay, and inadequate number of viral copies(<138 copies/mL). A negative result must be combined with clinical observations, patient history, and epidemiological information. The expected result is Negative.  Fact Sheet for Patients:  EntrepreneurPulse.com.au  Fact Sheet for Healthcare Providers:  IncredibleEmployment.be  This test is no t yet approved or cleared by the [place] FDA and  has been authorized for detection and/or diagnosis of SARS-CoV-2 by FDA under an Emergency Use Authorization (EUA). This EUA will remain  in effect (meaning this test can be used) for the duration of the COVID-19 declaration under Section 564(b)(1) of the Act, 21 U.S.C.section 360bbb-3(b)(1),  unless the authorization is terminated  or revoked sooner.       Influenza A by PCR NEGATIVE NEGATIVE Final   Influenza B by PCR NEGATIVE NEGATIVE Final    Comment: (NOTE) The Xpert Xpress SARS-CoV-2/FLU/RSV plus assay is intended as an aid in the diagnosis of influenza from Nasopharyngeal swab specimens and should not be used as a sole basis for treatment. Nasal washings and aspirates are unacceptable for Xpert Xpress SARS-CoV-2/FLU/RSV testing.  Fact Sheet for Patients: EntrepreneurPulse.com.au  Fact Sheet for Healthcare Providers: IncredibleEmployment.be  This test is not yet approved or cleared by the [place] FDA and has been authorized for detection and/or diagnosis of SARS-CoV-2 by FDA under an Emergency Use Authorization (EUA). This EUA will remain in effect (meaning this test can be used) for the duration of the COVID-19 declaration under Section 564(b)(1) of the Act, 21 U.S.C. section 360bbb-3(b)(1), unless the authorization is terminated or revoked.  Performed at [facility], [address], [place], [place] [address]      Scheduled Meds:  dextrose       feeding supplement  1 Container Oral TID BM   folic acid  1 mg Oral Daily   levothyroxine  112 mcg Oral Q0600   multivitamin with minerals  1 tablet Oral Daily   nystatin  1 application Topical TID   pantoprazole  40 mg Oral BID   sucralfate  1 g Oral TID WC & HS   Continuous Infusions:  doxycycline (VIBRAMYCIN) IV 100 mg (12/18/20 0935)    Procedures/Studies: CT HEAD WO CONTRAST (5MM)  Result Date: 12/17/2020 CLINICAL DATA:  Reduced vision in right eye EXAM: CT HEAD WITHOUT CONTRAST TECHNIQUE: Contiguous axial images were obtained from the base of the skull through the vertex without intravenous contrast. COMPARISON:  09/24/2020 FINDINGS: Brain: No evidence of acute infarction, hemorrhage, hydrocephalus, extra-axial collection or mass lesion/mass effect. Subcortical  white matter and periventricular small vessel ischemic changes. Vascular: Intracranial atherosclerosis. Skull: Normal. Negative for fracture or focal lesion. Sinuses/Orbits: The visualized paranasal sinuses are essentially clear. The mastoid air cells are unopacified. Mild proptosis of the bilateral orbits, left greater than right, unchanged. Retroconal soft tissues are within normal limits. Other: None. IMPRESSION: No evidence of acute intracranial abnormality. Small vessel ischemic changes. Mild proptosis of the bilateral orbits, left greater than right, unchanged. Retroconal soft tissues are within normal limits. Electronically Signed   By: Julian Hy M.D.   On: 12/17/2020 00:35   CT CHEST ABDOMEN PELVIS W CONTRAST  Result Date: 12/16/2020 CLINICAL DATA:  Unintended weight loss with loss of appetite increased weakness over the past several weeks. EXAM: CT CHEST, ABDOMEN, AND PELVIS WITH CONTRAST TECHNIQUE: Multidetector CT imaging of the chest, abdomen and pelvis was performed following the standard protocol during bolus administration of intravenous contrast. CONTRAST:  40mL OMNIPAQUE IOHEXOL 300 MG/ML  SOLN COMPARISON:  CT chest, abdomen, and pelvis dated September 26, 2020. FINDINGS: CT CHEST FINDINGS Cardiovascular: No significant vascular findings. Normal heart size. No pericardial effusion. No thoracic aortic aneurysm or dissection. Coronary, aortic arch, and branch vessel atherosclerotic vascular disease. No central pulmonary embolism. Mediastinum/Nodes: No enlarged mediastinal, hilar, or axillary lymph nodes. Diminutive or absent thyroid gland, unchanged. Trachea and esophagus demonstrate no significant findings. Lungs/Pleura: Unchanged 1.3 x 0.7 cm spiculated nodular density in the right upper lobe (series 3, image 25). Resolved interstitial infiltrates in the upper lobes and right middle lobe. Unchanged mild paraseptal emphysema in scattered minimally increased subpleural reticulation. No  focal consolidation, pleural effusion, or pneumothorax. Musculoskeletal: No chest wall mass or suspicious bone lesions identified. New acute to subacute mild T12 superior endplate compression fracture with minimal height loss. CT ABDOMEN PELVIS FINDINGS Hepatobiliary: No focal liver abnormality is seen. No gallstones, gallbladder wall thickening, or biliary dilatation. Pancreas: Unremarkable. No pancreatic ductal dilatation or surrounding inflammatory changes. Spleen: Normal in size without focal abnormality. Adrenals/Urinary Tract: The adrenal glands and both kidneys are unremarkable. No renal calculi or hydronephrosis. Homogeneously enhancing 2.1 x 1.4 cm right lateral bladder wall mass (series 2, image 105). Stomach/Bowel: Unchanged small hiatal hernia. Previously described possible mass of the gastric cardia represents normal folded gastric mucosa. The small bowel is unremarkable. Extensive sigmoid colonic diverticulosis again noted with chronic long segment wall thickening of the proximal and mid sigmoid colon and mild surrounding fat stranding. 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 History of prior appendectomy. Vascular/Lymphatic: Slightly increased bilobed fusiform aneurysmal dilatation of the infrarenal abdominal aorta measuring up to 3.9 cm (series 4, image 54), previously 3.8 cm (by my measurement). Visceral arteries are patent. Aortoiliac atherosclerotic vascular disease. No enlarged abdominal or pelvic lymph nodes. Reproductive: Uterus and bilateral adnexa are unremarkable. Other: Unchanged tiny fat containing umbilical hernia. No free fluid or pneumoperitoneum. Musculoskeletal: No acute or significant osseous findings. IMPRESSION: CT chest: 1. Unchanged 1.3 cm spiculated nodular density in the right upper lobe, favor apical scarring. Continued attention on follow-up is recommended. 2. New benign-appearing acute to subacute T12 superior endplate compression fracture. 3. Resolved  multifocal pneumonia. 4. Aortic Atherosclerosis (ICD10-I70.0) and Emphysema (ICD10-J43.9). CT abdomen pelvis: 1. 2.1 cm right lateral bladder wall mass, concerning for primary bladder carcinoma. Urine cytology and cystoscopy are recommended for further evaluation. 2. Slightly increased bilobed fusiform aneurysmal dilatation of the infrarenal abdominal aorta measuring up to 3.9 cm, previously 3.8 cm. Recommend follow-up every 2 years. This recommendation follows ACR consensus guidelines: White Paper of the ACR Incidental Findings Committee II on Vascular Findings. J Am Coll Radiol 2013; 10:789-794. 3. 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 Electronically Signed   By: Titus Dubin M.D.   On: 12/16/2020 13:36    Orson Eva, DO  [facility]  If 7PM-7AM, please contact night-coverage [email or web address] Password [email or web address] 12/18/2020, 2:59 PM   LOS: 1 day

## 2020-12-18 NOTE — Anesthesia Postprocedure Evaluation (Signed)
Anesthesia Post Note  Patient: Olivia Blanchard  Procedure(s) Performed: ESOPHAGOGASTRODUODENOSCOPY (EGD) WITH PROPOFOL BIOPSY  Patient location during evaluation: PACU Anesthesia Type: General Level of consciousness: awake and alert and oriented Pain management: pain level controlled Vital Signs Assessment: post-procedure vital signs reviewed and stable Respiratory status: spontaneous breathing, nonlabored ventilation and respiratory function stable Cardiovascular status: blood pressure returned to baseline and stable Postop Assessment: no apparent nausea or vomiting Anesthetic complications: no   No notable events documented.   Last Vitals:  Vitals:   12/18/20 1200 12/18/20 1238  BP: 111/70 119/77  Pulse:  80  Resp:  17  Temp: 36.7 C 36.5 C  SpO2:  100%    Last Pain:  Vitals:   12/18/20 1140  TempSrc:   PainSc: 0-No pain                 Yentl Verge C Ahlayah Tarkowski

## 2020-12-19 ENCOUNTER — Ambulatory Visit (HOSPITAL_COMMUNITY): Payer: PPO | Admitting: Hematology

## 2020-12-19 ENCOUNTER — Telehealth: Payer: Self-pay | Admitting: Internal Medicine

## 2020-12-19 ENCOUNTER — Encounter (HOSPITAL_COMMUNITY): Payer: Self-pay | Admitting: Gastroenterology

## 2020-12-19 ENCOUNTER — Inpatient Hospital Stay (HOSPITAL_COMMUNITY): Payer: PPO

## 2020-12-19 ENCOUNTER — Other Ambulatory Visit: Payer: Self-pay | Admitting: Urology

## 2020-12-19 DIAGNOSIS — R634 Abnormal weight loss: Secondary | ICD-10-CM

## 2020-12-19 DIAGNOSIS — D494 Neoplasm of unspecified behavior of bladder: Secondary | ICD-10-CM

## 2020-12-19 DIAGNOSIS — N3289 Other specified disorders of bladder: Secondary | ICD-10-CM

## 2020-12-19 DIAGNOSIS — E43 Unspecified severe protein-calorie malnutrition: Secondary | ICD-10-CM

## 2020-12-19 LAB — COMPREHENSIVE METABOLIC PANEL
ALT: 15 U/L (ref 0–44)
AST: 31 U/L (ref 15–41)
Albumin: 2.2 g/dL — ABNORMAL LOW (ref 3.5–5.0)
Alkaline Phosphatase: 66 U/L (ref 38–126)
Anion gap: 12 (ref 5–15)
BUN: <5 mg/dL — ABNORMAL LOW (ref 8–23)
CO2: 28 mmol/L (ref 22–32)
Calcium: 7.4 mg/dL — ABNORMAL LOW (ref 8.9–10.3)
Chloride: 94 mmol/L — ABNORMAL LOW (ref 98–111)
Creatinine, Ser: 0.46 mg/dL (ref 0.44–1.00)
GFR, Estimated: >60 mL/min (ref >60)
Glucose, Bld: 61 mg/dL — ABNORMAL LOW (ref 70–99)
Potassium: 2.7 mmol/L — CL (ref 3.5–5.1)
Sodium: 134 mmol/L — ABNORMAL LOW (ref 135–145)
Total Bilirubin: 1.3 mg/dL — ABNORMAL HIGH (ref 0.3–1.2)
Total Protein: 5.6 g/dL — ABNORMAL LOW (ref 6.5–8.1)

## 2020-12-19 LAB — CBC
HCT: 30.5 % — ABNORMAL LOW (ref 36.0–46.0)
Hemoglobin: 9.4 g/dL — ABNORMAL LOW (ref 12.0–15.0)
MCH: 27.2 pg (ref 26.0–34.0)
MCHC: 30.8 g/dL (ref 30.0–36.0)
MCV: 88.2 fL (ref 80.0–100.0)
Platelets: 75 10*3/uL — ABNORMAL LOW (ref 150–400)
RBC: 3.46 MIL/uL — ABNORMAL LOW (ref 3.87–5.11)
RDW: 15.8 % — ABNORMAL HIGH (ref 11.5–15.5)
WBC: 3.4 10*3/uL — ABNORMAL LOW (ref 4.0–10.5)
nRBC: 0 % (ref 0.0–0.2)

## 2020-12-19 LAB — MAGNESIUM: Magnesium: 1.4 mg/dL — ABNORMAL LOW (ref 1.7–2.4)

## 2020-12-19 MED ORDER — MAGNESIUM SULFATE 2 GM/50ML IV SOLN
2.0000 g | Freq: Once | INTRAVENOUS | Status: AC
  Administered 2020-12-19: 2 g via INTRAVENOUS
  Filled 2020-12-19: qty 50

## 2020-12-19 MED ORDER — POTASSIUM CHLORIDE CRYS ER 20 MEQ PO TBCR
40.0000 meq | EXTENDED_RELEASE_TABLET | Freq: Once | ORAL | Status: AC
  Administered 2020-12-19: 40 meq via ORAL
  Filled 2020-12-19: qty 2

## 2020-12-19 MED ORDER — GADOBUTROL 1 MMOL/ML IV SOLN
5.0000 mL | Freq: Once | INTRAVENOUS | Status: AC | PRN
  Administered 2020-12-19: 5 mL via INTRAVENOUS

## 2020-12-19 MED ORDER — POTASSIUM CHLORIDE 10 MEQ/100ML IV SOLN
10.0000 meq | INTRAVENOUS | Status: AC
  Administered 2020-12-19 (×4): 10 meq via INTRAVENOUS
  Filled 2020-12-19 (×3): qty 100

## 2020-12-19 NOTE — Plan of Care (Signed)
°  Problem: Acute Rehab PT Goals(only PT should resolve) Goal: Pt Will Go Supine/Side To Sit Outcome: Progressing Flowsheets (Taken 12/19/2020 1231) Pt will go Supine/Side to Sit: with modified independence Goal: Pt Will Go Sit To Supine/Side Outcome: Progressing Flowsheets (Taken 12/19/2020 1231) Pt will go Sit to Supine/Side: with modified independence Goal: Patient Will Transfer Sit To/From Stand Outcome: Progressing Flowsheets (Taken 12/19/2020 1231) Patient will transfer sit to/from stand: with min guard assist Goal: Pt Will Transfer Bed To Chair/Chair To Bed Outcome: Progressing Flowsheets (Taken 12/19/2020 1231) Pt will Transfer Bed to Chair/Chair to Bed: with min assist Goal: Pt Will Ambulate Outcome: Progressing Flowsheets (Taken 12/19/2020 1231) Pt will Ambulate:  15 feet  with minimal assist  with least restrictive assistive device Goal: Pt Will Go Up/Down Stairs Outcome: Progressing Flowsheets (Taken 12/19/2020 1231) Pt will Go Up / Down Stairs:  3-5 stairs  with minimal assist  with least restrictive assistive device   Pamala Hurry D. Hartnett-Rands, MS, PT Per [facility] [phone] 12/19/2020

## 2020-12-19 NOTE — Progress Notes (Signed)
Date and time results received: 12/19/20 753 (use smartphrase ".now" to insert current time)  Test: K+ Critical Value: 2.7  Name of Provider Notified: Orson Eva, MD  Orders Received? Or Actions Taken?: See new orders

## 2020-12-19 NOTE — Evaluation (Addendum)
Physical Therapy Evaluation Patient Details Name: Olivia Blanchard MRN: [id number] DOB: 1950-01-31 Today's Date: 12/19/2020  History of Present Illness  [name] is a 70yo with a history of hypertension and tobacco abuse who was admitted 12/16/2020 with fatigue, weight loss and nausea/vomiting. She underwent CT which showed a possible gastric mass and a 2.5cm right lateral wall bladder mass. She denies any history of gross hematuria. She denies any dysuria or significant LUTS. She does have a 50lb weight loss since September 2022 due to inability to eat. She has a +50pk year smoking history.   Clinical Impression  Husband exited room as PT entered room. Patient agreeable to participating in PT evaluation today. Patient reports she was able to drink some coffee this morning. Patient required min assist to supervision for bed mobility. Patient required verbal cues on use of RW and sequencing of steps and placement of feet. Upon standing, patient required assistance to maintain a standing position as she was loosing her balance posteriorly and unable to correct independently. Patient had previously agreed to getting out of bed to chair but upon standing, fear/anxiety of walking and falling limited her and she refused to take steps forward with RW and therapist assist. PT will continue to attempt ambulation on subsequent sessions. Patient reported she was walking to the bathroom at home prior to admission. Her husband would hold both of her hands. Patient reported she has a RW at home but has not used it.  Upon exiting room, husband sitting in hallway. Reviewed gross motor mobility plan and recommendation to rehab with husband. He agreed. Patient would continue to benefit from skilled physical therapy in current environment and next venue to continue return to prior function and increase strength, endurance, balance, coordination, and functional mobility and gait skills.       Recommendations for follow  up therapy are one component of a multi-disciplinary discharge planning process, led by the attending physician.  Recommendations may be updated based on patient status, additional functional criteria and insurance authorization.  Follow Up Recommendations Skilled nursing-short term rehab (<3 hours/day)    Assistance Recommended at Discharge Intermittent Supervision/Assistance  Functional Status Assessment Patient has had a recent decline in their functional status and demonstrates the ability to make significant improvements in function in a reasonable and predictable amount of time.  Equipment Recommendations  None recommended by PT    Recommendations for Other Services       Precautions / Restrictions Precautions Precautions: Fall Precaution Comments: 3 falls in the last 3 months Restrictions Weight Bearing Restrictions: No      Mobility  Bed Mobility Overal bed mobility: Needs Assistance Bed Mobility: Supine to Sit;Sit to Supine     Supine to sit: Min assist;HOB elevated (for trunk) Sit to supine: Supervision   General bed mobility comments: slow, labored    Transfers Overall transfer level: Needs assistance Equipment used: Rolling walker (2 wheels) Transfers: Sit to/from Stand Sit to Stand: Min guard;Min assist           General transfer comment: slow, labored, verbal cues for sequencing of steps and placement of hands; posterior lean upon standing; patient unable to correct    Ambulation/Gait     General Gait Details: refused upon standing due to fear of falling  Stairs            Wheelchair Mobility    Modified Rankin (Stroke Patients Only)       Balance Overall balance assessment: Needs assistance Sitting-balance support: Bilateral upper  extremity supported;Feet supported Sitting balance-Leahy Scale: Good   Postural control: Posterior lean Standing balance support: No upper extremity supported;Reliant on assistive device for  balance Standing balance-Leahy Scale: Poor Standing balance comment: fair with RW         Pertinent Vitals/Pain Pain Assessment: 0-10 Pain Score: 0-No pain Pain Intervention(s): Monitored during session    Home Living Family/patient expects to be discharged to:: Private residence Living Arrangements: Spouse/significant other (grandson 23 years old) Available Help at Discharge: Family;Available 24 hours/day Type of Home: House Home Access: Stairs to enter Entrance Stairs-Rails: None Entrance Stairs-Number of Steps: 4   Home Layout: One level Home Equipment: Hand held shower head;Shower seat;BSC/3in1;Rolling Environmental consultant (2 wheels)      Prior Function Prior Level of Function : Needs assist;History of Falls (last six months)       Physical Assist : Mobility (physical);ADLs (physical) Mobility (physical): Gait;Stairs;Transfers ADLs (physical): Grooming;Bathing;Dressing;IADLs Mobility Comments: independent prior to 3 months ago ADLs Comments: independent prior to 3 months ago     Hand Dominance   Dominant Hand: Left    Extremity/Trunk Assessment   Upper Extremity Assessment Upper Extremity Assessment: Generalized weakness    Lower Extremity Assessment Lower Extremity Assessment: Generalized weakness    Cervical / Trunk Assessment Cervical / Trunk Assessment: Kyphotic  Communication   Communication: No difficulties  Cognition Arousal/Alertness: Awake/alert Behavior During Therapy: WFL for tasks assessed/performed Overall Cognitive Status: Within Functional Limits for tasks assessed          General Comments      Exercises     Assessment/Plan    PT Assessment Patient needs continued PT services  PT Problem List Decreased strength;Decreased mobility;Decreased activity tolerance;Decreased balance;Decreased knowledge of use of DME       PT Treatment Interventions DME instruction;Therapeutic exercise;Gait training;Balance training;Stair training;Neuromuscular  re-education;Functional mobility training;Therapeutic activities;Patient/family education    PT Goals (Current goals can be found in the Care Plan section)  Acute Rehab PT Goals Patient Stated Goal: be able to eat; go home PT Goal Formulation: With patient/family Time For Goal Achievement: 01/02/21 Potential to Achieve Goals: Fair    Frequency Min 3X/week   Barriers to discharge           AM-PAC PT "6 Clicks" Mobility  Outcome Measure Help needed turning from your back to your side while in a flat bed without using bedrails?: None Help needed moving from lying on your back to sitting on the side of a flat bed without using bedrails?: A Little Help needed moving to and from a bed to a chair (including a wheelchair)?: A Lot Help needed standing up from a chair using your arms (e.g., wheelchair or bedside chair)?: A Lot Help needed to walk in hospital room?: A Lot Help needed climbing 3-5 steps with a railing? : A Lot 6 Click Score: 15    End of Session Equipment Utilized During Treatment: Gait belt Activity Tolerance: Patient limited by fatigue Patient left: in bed;with call bell/phone within reach;with family/visitor present Nurse Communication: Mobility status PT Visit Diagnosis: Unsteadiness on feet (R26.81);Other abnormalities of gait and mobility (R26.89);Repeated falls (R29.6);Muscle weakness (generalized) (M62.81)    Time: 6063-0160 PT Time Calculation (min) (ACUTE ONLY): 25 min   Charges:   PT Evaluation $PT Eval Low Complexity: 1 Low PT Treatments $Therapeutic Activity: 8-22 mins        Floria Raveling. Hartnett-Rands, MS, PT Per [place] [phone]  Pamala Hurry  Hartnett-Rands 12/19/2020, 12:25 PM

## 2020-12-19 NOTE — Telephone Encounter (Signed)
Good morning.  Patient needs hospital follow-up at [facility] in 6 to 8 weeks.  Can we arrange this?  Thank you

## 2020-12-19 NOTE — Progress Notes (Signed)
Patient seen this AM.  Tolerating diet.  EGD yesterday showed LA grade C esophagitis.  Recommend twice daily pantoprazole 40 mg for 12 weeks.  Would also recommend continuing Carafate 1 g 4 times daily upon discharge.  We will plan on repeat upper endoscopy in 3 months.  At the same time we will consider colonoscopy as well.  We will follow-up on biopsies and treat for H. pylori if indicated. I will arrange GI follow-up.  GI to sign off, please call with any questions concerns.

## 2020-12-19 NOTE — Consult Note (Signed)
Urology Consult  Referring physician: Dr. Carles Collet Reason for referral: Bladder tumor  Chief Complaint: Olivia Blanchard loss  History of Present Illness: [name] is a 70yo with a history of hypertension and tobacco abuse who was admitted 12/16/2020 with fatigue, weight loss and nausea/vomiting. She underwent CT which showed a possible gastric mass and a 2.5cm right lateral wall bladder mass. She denies any history of gross hematuria. She denies any dysuria or significant LUTS. She does have a 50lb weight loss since September 2022 due to inability to eat. She has a +50pk year smoking history.  Past Medical History:  Diagnosis Date   Grave's disease    HYPERTENSION 09/30/2007   HYPOTHYROIDISM 09/30/2007   Tobacco abuse    Past Surgical History:  Procedure Laterality Date   APPENDECTOMY     cataract surgery Right    cataract surgery Left     Medications: I have reviewed the patient's current medications. Allergies:  Allergies  Allergen Reactions   Codeine Phosphate    Lidocaine Palpitations    "stuff the dentist uses to numb your teeth" causes tachycardia.   Prednisone Palpitations    tachycardia    Family History  Problem Relation Age of Onset   Diabetes Mother    Thyroid disease Mother    Cancer Neg Hx        sister s- lung Ca, copd   Social History:  reports that she quit smoking about 2 months ago. Her smoking use included cigarettes. She smoked an average of 1 pack per day. She has never used smokeless tobacco. She reports that she does not drink alcohol and does not use drugs.  Review of Systems  Constitutional:  Positive for fatigue and unexpected weight change.  Gastrointestinal:  Positive for abdominal pain.  All other systems reviewed and are negative.  Physical Exam:  Vital signs in last 24 hours: Temp:  [97.6 F (36.4 C)-98.1 F (36.7 C)] 97.6 F (36.4 C) (12/15 0400) Pulse Rate:  [79-88] 88 (12/15 0400) Resp:  [15-28] 19 (12/15 0400) BP: (111-126)/(57-77) 117/66  (12/15 0400) SpO2:  [97 %-100 %] 97 % (12/15 0400) Physical Exam Vitals reviewed.  Constitutional:      Appearance: She is cachectic.  HENT:     Head: Normocephalic and atraumatic.     Mouth/Throat:     Mouth: Mucous membranes are dry.  Eyes:     Extraocular Movements: Extraocular movements intact.     Pupils: Pupils are equal, round, and reactive to light.  Cardiovascular:     Rate and Rhythm: Normal rate and regular rhythm.  Pulmonary:     Effort: Pulmonary effort is normal. No respiratory distress.  Abdominal:     General: Abdomen is flat. There is no distension.  Musculoskeletal:        General: No swelling. Normal range of motion.     Cervical back: Normal range of motion and neck supple.  Skin:    General: Skin is warm and dry.  Neurological:     General: No focal deficit present.     Mental Status: She is alert and oriented to person, place, and time.  Psychiatric:        Mood and Affect: Mood normal.        Behavior: Behavior normal.        Thought Content: Thought content normal.        Judgment: Judgment normal.    Laboratory Data:  Results for orders placed or performed during the hospital encounter of 12/16/20 (  from the past 72 hour(s))  Comprehensive metabolic panel     Status: Abnormal   Collection Time: 12/16/20 11:10 AM  Result Value Ref Range   Sodium 129 (L) 135 - 145 mmol/L   Potassium 2.5 (LL) 3.5 - 5.1 mmol/L    Comment: CRITICAL RESULT CALLED TO, READ BACK BY AND VERIFIED WITH: MCNEIL,M AT 12:10PM ON 12/16/20 BY FESTERMAN,C    Chloride 85 (L) 98 - 111 mmol/L   CO2 27 22 - 32 mmol/L   Glucose, Bld 94 70 - 99 mg/dL    Comment: Glucose reference range applies only to samples taken after fasting for at least 8 hours.   BUN 10 8 - 23 mg/dL   Creatinine, Ser 0.60 0.44 - 1.00 mg/dL   Calcium 8.0 (L) 8.9 - 10.3 mg/dL   Total Protein 6.7 6.5 - 8.1 g/dL   Albumin 2.4 (L) 3.5 - 5.0 g/dL   AST 59 (H) 15 - 41 U/L   ALT 18 0 - 44 U/L   Alkaline  Phosphatase 84 38 - 126 U/L   Total Bilirubin 1.6 (H) 0.3 - 1.2 mg/dL   GFR, Estimated >60 >60 mL/min    Comment: (NOTE) Calculated using the CKD-EPI Creatinine Equation (2021)    Anion gap 17 (H) 5 - 15    Comment: Performed at [facility], [address] [place], [place] [address]  Ethanol     Status: None   Collection Time: 12/16/20 11:10 AM  Result Value Ref Range   Alcohol, Ethyl (B) <10 <10 mg/dL    Comment: (NOTE) Lowest detectable limit for serum alcohol is 10 mg/dL.  For medical purposes only. Performed at [facility], [address] [place], [place] [address]   CBC with Differential     Status: Abnormal   Collection Time: 12/16/20 11:10 AM  Result Value Ref Range   WBC 5.2 4.0 - 10.5 K/uL   RBC 2.98 (L) 3.87 - 5.11 MIL/uL   Hemoglobin 8.2 (L) 12.0 - 15.0 g/dL   HCT 26.8 (L) 36.0 - 46.0 %   MCV 89.9 80.0 - 100.0 fL   MCH 27.5 26.0 - 34.0 pg   MCHC 30.6 30.0 - 36.0 g/dL   RDW 16.8 (H) 11.5 - 15.5 %   Platelets 120 (L) 150 - 400 K/uL   nRBC 0.0 0.0 - 0.2 %   Neutrophils Relative % 76 %   Neutro Abs 3.9 1.7 - 7.7 K/uL   Lymphocytes Relative 17 %   Lymphs Abs 0.9 0.7 - 4.0 K/uL   Monocytes Relative 6 %   Monocytes Absolute 0.3 0.1 - 1.0 K/uL   Eosinophils Relative 0 %   Eosinophils Absolute 0.0 0.0 - 0.5 K/uL   Basophils Relative 0 %   Basophils Absolute 0.0 0.0 - 0.1 K/uL   Immature Granulocytes 1 %   Abs Immature Granulocytes 0.06 0.00 - 0.07 K/uL    Comment: Performed at [facility], [address], [place], [place] [address]  Magnesium     Status: Abnormal   Collection Time: 12/16/20 11:10 AM  Result Value Ref Range   Magnesium 1.6 (L) 1.7 - 2.4 mg/dL    Comment: Performed at [facility], [address], [place], [place] [address]  Resp Panel by RT-PCR (Flu A&B, Covid) Nasopharyngeal Swab     Status: None   Collection Time: 12/16/20 11:50 AM   Specimen: Nasopharyngeal Swab; Nasopharyngeal(NP) swabs in vial transport medium  Result Value Ref Range   SARS  Coronavirus 2 by  RT PCR NEGATIVE NEGATIVE    Comment: (NOTE) SARS-CoV-2 target nucleic acids are NOT DETECTED.  The SARS-CoV-2 RNA is generally detectable in upper respiratory specimens during the acute phase of infection. The lowest concentration of SARS-CoV-2 viral copies this assay can detect is 138 copies/mL. A negative result does not preclude SARS-Cov-2 infection and should not be used as the sole basis for treatment or other patient management decisions. A negative result may occur with  improper specimen collection/handling, submission of specimen other than nasopharyngeal swab, presence of viral mutation(s) within the areas targeted by this assay, and inadequate number of viral copies(<138 copies/mL). A negative result must be combined with clinical observations, patient history, and epidemiological information. The expected result is Negative.  Fact Sheet for Patients:  EntrepreneurPulse.com.au  Fact Sheet for Healthcare Providers:  IncredibleEmployment.be  This test is no t yet approved or cleared by the [place] FDA and  has been authorized for detection and/or diagnosis of SARS-CoV-2 by FDA under an Emergency Use Authorization (EUA). This EUA will remain  in effect (meaning this test can be used) for the duration of the COVID-19 declaration under Section 564(b)(1) of the Act, 21 U.S.C.section 360bbb-3(b)(1), unless the authorization is terminated  or revoked sooner.       Influenza A by PCR NEGATIVE NEGATIVE   Influenza B by PCR NEGATIVE NEGATIVE    Comment: (NOTE) The Xpert Xpress SARS-CoV-2/FLU/RSV plus assay is intended as an aid in the diagnosis of influenza from Nasopharyngeal swab specimens and should not be used as a sole basis for treatment. Nasal washings and aspirates are unacceptable for Xpert Xpress SARS-CoV-2/FLU/RSV testing.  Fact Sheet for Patients: EntrepreneurPulse.com.au  Fact Sheet  for Healthcare Providers: IncredibleEmployment.be  This test is not yet approved or cleared by the [place] FDA and has been authorized for detection and/or diagnosis of SARS-CoV-2 by FDA under an Emergency Use Authorization (EUA). This EUA will remain in effect (meaning this test can be used) for the duration of the COVID-19 declaration under Section 564(b)(1) of the Act, 21 U.S.C. section 360bbb-3(b)(1), unless the authorization is terminated or revoked.  Performed at [facility], [address], [place], [place] [address]   Basic metabolic panel     Status: Abnormal   Collection Time: 12/17/20  5:13 AM  Result Value Ref Range   Sodium 133 (L) 135 - 145 mmol/L   Potassium 4.1 3.5 - 5.1 mmol/L    Comment: DELTA CHECK NOTED   Chloride 95 (L) 98 - 111 mmol/L   CO2 25 22 - 32 mmol/L   Glucose, Bld 61 (L) 70 - 99 mg/dL    Comment: Glucose reference range applies only to samples taken after fasting for at least 8 hours.   BUN 8 8 - 23 mg/dL   Creatinine, Ser 0.47 0.44 - 1.00 mg/dL   Calcium 7.3 (L) 8.9 - 10.3 mg/dL   GFR, Estimated >60 >60 mL/min    Comment: (NOTE) Calculated using the CKD-EPI Creatinine Equation (2021)    Anion gap 13 5 - 15    Comment: Performed at [facility], [address], [place], [place] [address]  CBC     Status: Abnormal   Collection Time: 12/17/20  5:13 AM  Result Value Ref Range   WBC 2.7 (L) 4.0 - 10.5 K/uL   RBC 2.21 (L) 3.87 - 5.11 MIL/uL   Hemoglobin 6.1 (LL) 12.0 - 15.0 g/dL    Comment: This critical result has verified and been called to [place] by  Rozelle Logan on 12 13 2022 at [phone], and has been read back. CRITICAL RESULTS VERIFIED   HCT 20.1 (L) 36.0 - 46.0 %   MCV 91.0 80.0 - 100.0 fL   MCH 27.6 26.0 - 34.0 pg   MCHC 30.3 30.0 - 36.0 g/dL   RDW 16.8 (H) 11.5 - 15.5 %   Platelets 79 (L) 150 - 400 K/uL    Comment: SPECIMEN CHECKED FOR CLOTS Immature Platelet Fraction may be clinically indicated,  consider ordering this additional test [id number] REPEATED TO VERIFY PLATELET COUNT CONFIRMED BY SMEAR    nRBC 0.0 0.0 - 0.2 %    Comment: Performed at [facility], [address], [place], [place] [address]  HIV Antibody (routine testing w rflx)     Status: None   Collection Time: 12/17/20  5:13 AM  Result Value Ref Range   HIV Screen 4th Generation wRfx Non Reactive Non Reactive    Comment: Performed at [facility], [place] [address] [place], [place] [address]  Magnesium     Status: None   Collection Time: 12/17/20  5:13 AM  Result Value Ref Range   Magnesium 1.9 1.7 - 2.4 mg/dL    Comment: Performed at [facility], [address] [place], [place] [address]  ABO/Rh     Status: None   Collection Time: 12/17/20  5:13 AM  Result Value Ref Range   ABO/RH(D)      O POS Performed at [facility], [address] [place], [place] [address]   Hepatic function panel     Status: Abnormal   Collection Time: 12/17/20  5:13 AM  Result Value Ref Range   Total Protein 5.4 (L) 6.5 - 8.1 g/dL   Albumin 2.0 (L) 3.5 - 5.0 g/dL   AST 45 (H) 15 - 41 U/L   ALT 15 0 - 44 U/L   Alkaline Phosphatase 63 38 - 126 U/L   Total Bilirubin 1.3 (H) 0.3 - 1.2 mg/dL   Bilirubin, Direct 0.2 0.0 - 0.2 mg/dL   Indirect Bilirubin 1.1 (H) 0.3 - 0.9 mg/dL    Comment: Performed at [facility], [address], [place], [place] [address]  Type and screen [facility]     Status: None   Collection Time: 12/17/20  7:46 AM  Result Value Ref Range   ABO/RH(D) O POS    Antibody Screen NEG    Sample Expiration 12/20/2020,2359    Unit Number [id number]    Blood Component Type RED CELLS,LR    Unit division 00    Status of Unit ISSUED,FINAL    Transfusion Status OK TO TRANSFUSE    Crossmatch Result      Compatible Performed at [facility], [address] [place], [place] [address]    Unit Number [id number]    Blood Component Type RED CELLS,LR    Unit division 00    Status of Unit ISSUED,FINAL     Transfusion Status OK TO TRANSFUSE    Crossmatch Result Compatible   Prepare RBC (crossmatch)     Status: None   Collection Time: 12/17/20  7:46 AM  Result Value Ref Range   Order Confirmation      ORDER PROCESSED BY BLOOD BANK Performed at [facility], [address], [place],  [address]   Hemoglobin and hematocrit, blood     Status: Abnormal   Collection Time: 12/17/20  4:55 PM  Result Value Ref Range   Hemoglobin 9.9 (L) 12.0 - 15.0 g/dL  Comment: REPEATED TO VERIFY POST TRANSFUSION SPECIMEN    HCT 31.5 (L) 36.0 - 46.0 %    Comment: Performed at [facility], [address], [place], [place] [address]  CBC     Status: Abnormal   Collection Time: 12/18/20  4:56 AM  Result Value Ref Range   WBC 4.0 4.0 - 10.5 K/uL   RBC 3.65 (L) 3.87 - 5.11 MIL/uL   Hemoglobin 10.2 (L) 12.0 - 15.0 g/dL   HCT 32.0 (L) 36.0 - 46.0 %   MCV 87.7 80.0 - 100.0 fL   MCH 27.9 26.0 - 34.0 pg   MCHC 31.9 30.0 - 36.0 g/dL   RDW 16.0 (H) 11.5 - 15.5 %   Platelets 90 (L) 150 - 400 K/uL    Comment: SPECIMEN CHECKED FOR CLOTS Immature Platelet Fraction may be clinically indicated, consider ordering this additional test [id number] PLATELET COUNT CONFIRMED BY SMEAR    nRBC 0.0 0.0 - 0.2 %    Comment: Performed at [facility], [address] [place], [place] [address]  Basic metabolic panel     Status: Abnormal   Collection Time: 12/18/20  4:56 AM  Result Value Ref Range   Sodium 133 (L) 135 - 145 mmol/L   Potassium 3.4 (L) 3.5 - 5.1 mmol/L    Comment: DELTA CHECK NOTED   Chloride 94 (L) 98 - 111 mmol/L   CO2 25 22 - 32 mmol/L   Glucose, Bld 55 (L) 70 - 99 mg/dL    Comment: Glucose reference range applies only to samples taken after fasting for at least 8 hours.   BUN 6 (L) 8 - 23 mg/dL   Creatinine, Ser 0.45 0.44 - 1.00 mg/dL   Calcium 7.1 (L) 8.9 - 10.3 mg/dL   GFR, Estimated >60 >60 mL/min    Comment: (NOTE) Calculated using the CKD-EPI Creatinine Equation (2021)    Anion gap 14 5  - 15    Comment: Performed at [facility], [address], [place], [place] [address]  Glucose, capillary     Status: Abnormal   Collection Time: 12/18/20 10:48 AM  Result Value Ref Range   Glucose-Capillary 68 (L) 70 - 99 mg/dL    Comment: Glucose reference range applies only to samples taken after fasting for at least 8 hours.  Glucose, capillary     Status: Abnormal   Collection Time: 12/18/20 11:57 AM  Result Value Ref Range   Glucose-Capillary 145 (H) 70 - 99 mg/dL    Comment: Glucose reference range applies only to samples taken after fasting for at least 8 hours.  Vitamin B12     Status: None   Collection Time: 12/18/20  3:46 PM  Result Value Ref Range   Vitamin B-12 708 180 - 914 pg/mL    Comment: (NOTE) This assay is not validated for testing neonatal or myeloproliferative syndrome specimens for Vitamin B12 levels. Performed at [facility], [address] [place], [place] [address]   Iron and TIBC     Status: Abnormal   Collection Time: 12/18/20  3:46 PM  Result Value Ref Range   Iron 123 28 - 170 ug/dL   TIBC 126 (L) 250 - 450 ug/dL   Saturation Ratios 97 (H) 10.4 - 31.8 %   UIBC 3 ug/dL    Comment: Performed at [facility], [address] [place], [place] [address]  Ferritin     Status: Abnormal   Collection Time: 12/18/20  3:46 PM  Result Value Ref Range  Ferritin 548 (H) 11 - 307 ng/mL    Comment: Performed at [facility], [address] [place], [place] [address]  Folate     Status: None   Collection Time: 12/18/20  3:51 PM  Result Value Ref Range   Folate 10.7 >5.9 ng/mL    Comment: Performed at [facility], [address] [place], [place] [address]  TSH     Status: None   Collection Time: 12/18/20  3:54 PM  Result Value Ref Range   TSH 0.994 0.350 - 4.500 uIU/mL    Comment: Performed by a 3rd Generation assay with a functional sensitivity of <=0.01 uIU/mL. Performed at [facility], [address], [place], [place] [address]   Comprehensive metabolic  panel     Status: Abnormal   Collection Time: 12/19/20  7:23 AM  Result Value Ref Range   Sodium 134 (L) 135 - 145 mmol/L   Potassium 2.7 (LL) 3.5 - 5.1 mmol/L    Comment: CRITICAL RESULT CALLED TO, READ BACK BY AND VERIFIED WITH: MCMILLAN,B AT 7:50AM ON 12/19/20 BY FESTERMAN,C    Chloride 94 (L) 98 - 111 mmol/L   CO2 28 22 - 32 mmol/L   Glucose, Bld 61 (L) 70 - 99 mg/dL    Comment: Glucose reference range applies only to samples taken after fasting for at least 8 hours.   BUN <5 (L) 8 - 23 mg/dL   Creatinine, Ser 0.46 0.44 - 1.00 mg/dL   Calcium 7.4 (L) 8.9 - 10.3 mg/dL   Total Protein 5.6 (L) 6.5 - 8.1 g/dL   Albumin 2.2 (L) 3.5 - 5.0 g/dL   AST 31 15 - 41 U/L   ALT 15 0 - 44 U/L   Alkaline Phosphatase 66 38 - 126 U/L   Total Bilirubin 1.3 (H) 0.3 - 1.2 mg/dL   GFR, Estimated >60 >60 mL/min    Comment: (NOTE) Calculated using the CKD-EPI Creatinine Equation (2021)    Anion gap 12 5 - 15    Comment: Performed at [facility], [address] [place], [place] [address]  Magnesium     Status: Abnormal   Collection Time: 12/19/20  7:23 AM  Result Value Ref Range   Magnesium 1.4 (L) 1.7 - 2.4 mg/dL    Comment: Performed at [facility], [address], [place],  [address]   Recent Results (from the past 240 hour(s))  Resp Panel by RT-PCR (Flu A&B, Covid) Nasopharyngeal Swab     Status: None   Collection Time: 12/16/20 11:50 AM   Specimen: Nasopharyngeal Swab; Nasopharyngeal(NP) swabs in vial transport medium  Result Value Ref Range Status   SARS Coronavirus 2 by RT PCR NEGATIVE NEGATIVE Final    Comment: (NOTE) SARS-CoV-2 target nucleic acids are NOT DETECTED.  The SARS-CoV-2 RNA is generally detectable in upper respiratory specimens during the acute phase of infection. The lowest concentration of SARS-CoV-2 viral copies this assay can detect is 138 copies/mL. A negative result does not preclude SARS-Cov-2 infection and should not be used as the sole basis for  treatment or other patient management decisions. A negative result may occur with  improper specimen collection/handling, submission of specimen other than nasopharyngeal swab, presence of viral mutation(s) within the areas targeted by this assay, and inadequate number of viral copies(<138 copies/mL). A negative result must be combined with clinical observations, patient history, and epidemiological information. The expected result is Negative.  Fact Sheet for Patients:  EntrepreneurPulse.com.au  Fact Sheet for Healthcare Providers:  IncredibleEmployment.be  This  test is no t yet approved or cleared by the [place] and  has been authorized for detection and/or diagnosis of SARS-CoV-2 by FDA under an Emergency Use Authorization (EUA). This EUA will remain  in effect (meaning this test can be used) for the duration of the COVID-19 declaration under Section 564(b)(1) of the Act, 21 U.S.C.section 360bbb-3(b)(1), unless the authorization is terminated  or revoked sooner.       Influenza A by PCR NEGATIVE NEGATIVE Final   Influenza B by PCR NEGATIVE NEGATIVE Final    Comment: (NOTE) The Xpert Xpress SARS-CoV-2/FLU/RSV plus assay is intended as an aid in the diagnosis of influenza from Nasopharyngeal swab specimens and should not be used as a sole basis for treatment. Nasal washings and aspirates are unacceptable for Xpert Xpress SARS-CoV-2/FLU/RSV testing.  Fact Sheet for Patients: EntrepreneurPulse.com.au  Fact Sheet for Healthcare Providers: IncredibleEmployment.be  This test is not yet approved or cleared by the [place] FDA and has been authorized for detection and/or diagnosis of SARS-CoV-2 by FDA under an Emergency Use Authorization (EUA). This EUA will remain in effect (meaning this test can be used) for the duration of the COVID-19 declaration under Section 564(b)(1) of the Act, 21  U.S.C. section 360bbb-3(b)(1), unless the authorization is terminated or revoked.  Performed at [facility], [address] [place], [place] [address]    Creatinine: Recent Labs    12/16/20 1110 12/17/20 0513 12/18/20 0456 12/19/20 0723  CREATININE 0.60 0.47 0.45 0.46   Baseline Creatinine: 0.5  Impression/Assessment:  70yo with a bladder mass likely malignancy  Plan:  I discussed the management of bladder tumor with the patient including cystoscopy with transurethral resection. I will schedule her  bladder tumor resection as an outpatient.   Nicolette Bang 12/19/2020, 9:15 AM

## 2020-12-19 NOTE — NC FL2 (Signed)
[place] LEVEL OF CARE SCREENING TOOL     IDENTIFICATION  Patient Name: Olivia Blanchard Birthdate: [birth year] Sex: female Admission Date (Current Location): 12/16/2020  [facility] [place] Number:  [employer] and Address:  [place] [address] [place]      Provider Number: [id number]  Attending Physician Name and Address:  Orson Eva, MD  Relative Name and Phone Number:       Current Level of Care: Hospital Recommended Level of Care: [place] Prior Approval Number:    Date Approved/Denied:   PASRR Number: [id number]  Discharge Plan: SNF    Current Diagnoses: Patient Active Problem List   Diagnosis Date Noted   Protein-calorie malnutrition, severe 12/18/2020   Pancytopenia ([place]) 12/18/2020   Cancer of bladder wall (HCC)    Hypomagnesemia    Abnormal CT scan, sigmoid colon    Anemia    Nausea without vomiting    Hypokalemia 12/16/2020   Hyponatremia 12/16/2020   Bladder mass 12/16/2020   Candidal intertrigo 12/16/2020   Sigmoid thickening 10/30/2020   AAA (abdominal aortic aneurysm) without rupture 10/30/2020   Folate deficiency 10/23/2020   Loss of weight 10/23/2020   Gastric mass 10/23/2020   High serum ferritin 10/23/2020   Postablative hypothyroidism 09/30/2007   Essential hypertension 09/30/2007   URI 09/30/2007    Orientation RESPIRATION BLADDER Height & Weight     Self, Time, Situation, Place  Normal External catheter Weight: 138 lb 11.2 oz (62.9 kg) Height:  5\' 2"  (157.5 cm)  BEHAVIORAL SYMPTOMS/MOOD NEUROLOGICAL BOWEL NUTRITION STATUS      Incontinent Diet (Soft diet. See d/c summary for updates.)  AMBULATORY STATUS COMMUNICATION OF NEEDS Skin   Extensive Assist Verbally Bruising, Other (Comment) (non-pressure wound abdomen)                       Personal Care Assistance Level of Assistance  Bathing, Feeding, Dressing Bathing Assistance: Maximum  assistance Feeding assistance: Limited assistance Dressing Assistance: Maximum assistance     Functional Limitations Info  Sight, Hearing, Speech Sight Info: Impaired Hearing Info: Adequate Speech Info: Adequate    SPECIAL CARE FACTORS FREQUENCY  PT (By licensed PT)     PT Frequency: 5x weekly              Contractures      Additional Factors Info  Code Status, Allergies, Psychotropic Code Status Info: Full code Allergies Info: Codeine Phosphate, Lidocaine, Prednisone Psychotropic Info: Xanax         Current Medications (12/19/2020):  This is the current hospital active medication list Current Facility-Administered Medications  Medication Dose Route Frequency Provider Last Rate Last Admin   acetaminophen (TYLENOL) tablet 650 mg  650 mg Oral Q6H PRN Emokpae, Ejiroghene E, MD       Or   acetaminophen (TYLENOL) suppository 650 mg  650 mg Rectal Q6H PRN Emokpae, Ejiroghene E, MD       ALPRAZolam (XANAX) tablet 0.25 mg  0.25 mg Oral BID PRN Emokpae, Ejiroghene E, MD   0.25 mg at 12/18/20 2213   doxycycline (VIBRAMYCIN) 100 mg in sodium chloride 0.9 % 250 mL IVPB  100 mg Intravenous Q12H Emokpae, Ejiroghene E, MD 125 mL/hr at 12/19/20 0859 100 mg at 12/19/20 0859   feeding supplement (BOOST / RESOURCE BREEZE) liquid 1 Container  1 Container Oral TID BM Orson Eva, MD   1 Container at 47/82/95 6213   folic acid (FOLVITE)  tablet 1 mg  1 mg Oral Daily Barton Dubois, MD   1 mg at 12/19/20 4827   levothyroxine (SYNTHROID) tablet 112 mcg  112 mcg Oral Q0600 Emokpae, Ejiroghene E, MD   112 mcg at 12/19/20 0513   multivitamin with minerals tablet 1 tablet  1 tablet Oral Daily Tat, David, MD   1 tablet at 12/19/20 0816   nystatin (MYCOSTATIN/NYSTOP) topical powder 1 application  1 application Topical TID Emokpae, Ejiroghene E, MD   1 application at 07/86/75 0907   ondansetron (ZOFRAN) tablet 4 mg  4 mg Oral Q6H PRN Emokpae, Ejiroghene E, MD   4 mg at 12/17/20 2155   Or   ondansetron  (ZOFRAN) injection 4 mg  4 mg Intravenous Q6H PRN Emokpae, Ejiroghene E, MD       pantoprazole (PROTONIX) EC tablet 40 mg  40 mg Oral BID Barton Dubois, MD   40 mg at 12/19/20 0816   polyethylene glycol (MIRALAX / GLYCOLAX) packet 17 g  17 g Oral Daily PRN Emokpae, Ejiroghene E, MD       potassium chloride 10 mEq in 100 mL IVPB  10 mEq Intravenous Q1 Hr x 4 Tat, David, MD 100 mL/hr at 12/19/20 1124 10 mEq at 12/19/20 1124   sucralfate (CARAFATE) 1 GM/10ML suspension 1 g  1 g Oral TID WC & HS Harvel Quale, MD   1 g at 12/19/20 4492     Discharge Medications: Please see discharge summary for a list of discharge medications.  Relevant Imaging Results:  Relevant Lab Results:   Additional Information SSN: [id number].  Salome Arnt, LCSW

## 2020-12-19 NOTE — Progress Notes (Signed)
Surgical Physician Order Form [facility]  * Scheduling expectation :  2-3 weeks  *Length of Case: 30 minutes  *MD Preforming Case: Nicolette Bang, MD  *Assistant Needed: no  *Facility Preference: [facility]  *Clearance needed: no  *Anticoagulation Instructions: Hold all anticoagulants  *Aspirin Instructions: Ok to continue Aspirin  -Admit type: OUTpatient  -Anesthesia: General  -Use Standing Orders:  NA  *Diagnosis: Bladder Tumor  *Procedure:     TURBT 2-5cm (41290) cystoscopy, bilateral retrograde pyelograms, possible ureteral stent placement  Additional orders: Gemcitabine 2000mg  bladder instillation  -Equipment: C-Arm -VTE Prophylaxis Standing Order SCDs       Other:   -Standing Lab Orders Per Anesthesia    Lab other: None  -Standing Test orders EKG/Chest x-ray per Anesthesia       Test other:   - Medications:  Ancef 2gm IV  -Other orders:  PAS  *Post-op visit Date/Instructions:  1 week voiding trial

## 2020-12-19 NOTE — TOC Initial Note (Signed)
Transition of Care [facility]) - Initial/Assessment Note    Patient Details  Name: Olivia Blanchard MRN: [id number] Date of Birth: 1950-07-20  [facility] CM/SW Contact:    Salome Arnt, [place] Phone Number: 12/19/2020, 3:27 PM  Clinical Narrative:  Pt admitted due to hypokalemia. LCSW met with pt and husband at bedside. Pt reports their adult grandson also lives in the home. She indicates her family has assisted her with all ADLs for the past few months. She has had several falls in the last several weeks. Discussed PT recommendation for SNF. Pt adamantly refuses. She states, "I won't live at a facility for even one day." Pt is open to home health PT, but said she will not allow a RN as she had a bad experience last time with [facility]. She is agreeable to same home health agency as last time, but does not remember which one it was and thinks it starts with a "B". LCSW contacted [employer] [facility] (used to be [facility]). Alvis Lemmings has never had pt and Nanine Means is checking. TOC will follow up in AM. Anticipate d/c tomorrow.                   Expected Discharge Plan: [place] Barriers to Discharge: Continued Medical Work up   Patient Goals and CMS Choice Patient states their goals for this hospitalization and ongoing recovery are:: return home   Choice offered to / list presented to : Patient, Spouse  Expected Discharge Plan and Services Expected Discharge Plan: [place] In-house Referral: Clinical Social Work   Post Acute Care Choice: [place] arrangements for the past 2 months: [place]                 DME Arranged: N/A DME Agency: NA                  Prior Living Arrangements/Services Living arrangements for the past 2 months: [place] Lives with:: Spouse, Relatives Patient language and need for interpreter reviewed:: Yes Do you feel safe going back to the place where you live?: Yes       Need for Family Participation in Patient Care: Yes (Comment) Care giver support system in place?: Yes (comment) Current home services: DME (3N1, walker, shower seat) Criminal Activity/Legal Involvement Pertinent to Current Situation/Hospitalization: No - Comment as needed  Activities of Daily Living Home Assistive Devices/Equipment: None ADL Screening (condition at time of admission) Patient's cognitive ability adequate to safely complete daily activities?: No Is the patient deaf or have difficulty hearing?: No Does the patient have difficulty seeing, even when wearing glasses/contacts?: No Does the patient have difficulty concentrating, remembering, or making decisions?: No Patient able to express need for assistance with ADLs?: Yes Does the patient have difficulty dressing or bathing?: Yes Independently performs ADLs?: No Communication: Independent Dressing (OT): Needs assistance Is this a change from baseline?: Pre-admission baseline Grooming: Needs assistance Is this a change from baseline?: Pre-admission baseline Feeding: Independent Bathing: Needs assistance Is this a change from baseline?: Pre-admission baseline Toileting: Needs assistance Is this a change from baseline?: Pre-admission baseline In/Out Bed: Needs assistance Is this a change from baseline?: Pre-admission baseline Walks in Home: Needs assistance Is this a change from baseline?: Change from baseline, expected to last <3 days Does the patient have difficulty walking or climbing stairs?: Yes Weakness of Legs: Both Weakness of Arms/Hands: Both  Permission Sought/Granted  Emotional Assessment       Orientation: : Oriented to Self, Oriented to Place, Oriented to  Time, Oriented to Situation Alcohol / Substance Use: Not Applicable Psych Involvement: No (comment)  Admission diagnosis:  Hypokalemia [E87.6] Hypomagnesemia [E83.42] Cancer of bladder wall (HCC) [C67.9] Weakness  [R53.1] Gastric mass [K31.89] Patient Active Problem List   Diagnosis Date Noted   Protein-calorie malnutrition, severe 12/18/2020   Pancytopenia ([place]) 12/18/2020   Cancer of bladder wall (HCC)    Hypomagnesemia    Abnormal CT scan, sigmoid colon    Anemia    Nausea without vomiting    Hypokalemia 12/16/2020   Hyponatremia 12/16/2020   Bladder mass 12/16/2020   Candidal intertrigo 12/16/2020   Sigmoid thickening 10/30/2020   AAA (abdominal aortic aneurysm) without rupture 10/30/2020   Folate deficiency 10/23/2020   Loss of weight 10/23/2020   Gastric mass 10/23/2020   High serum ferritin 10/23/2020   Postablative hypothyroidism 09/30/2007   Essential hypertension 09/30/2007   URI 09/30/2007   PCP:  Caren Macadam, MD Pharmacy:   [facility] [place] [address] [place] [address] Phone: [phone] Fax: [phone]     Social Determinants of Health (SDOH) Interventions    Readmission Risk Interventions No flowsheet data found.

## 2020-12-19 NOTE — Progress Notes (Signed)
PROGRESS NOTE  Olivia Blanchard IHK:[id number] DOB: [birth year] DOA: 12/16/2020 PCP: Caren Macadam, MD  Brief History:  70 year old female with a history of Graves' disease status postradiation,, tension, tobacco abuse, abdominal aortic aneurysm presenting with 2 to 3 months of generalized weakness, decreased oral intake, weight loss, and nausea. She denies any fevers, chills, chest pain, abdominal pain, diarrhea, hematochezia, melena.  She has had increasing generalized weakness and fatigue.  She has had a 50 pound weight loss in September 2022.  In the ED, she was hemodynamically stable.  Labs on admission were remarkable for low potassium 2.5, sodium 129, chloride 85, mildly elevated AST of 59 with normal ALT of 18, albumin was low 2.4, total bili was mildly elevated 1.6, alkaline phosphatase was normal at 84, CBC showed a hemoglobin of 8.2 we will also count 5.2 and platelets of 120.  Repeat hemoglobin today dropped down to 6.1.  Patient is currently receiving 1 unit of PRBC.  CT of the chest, abdomen and pelvis with IV contrast showed a 2.1 cm bladder mass concerning for a bladder carcinoma and presence of a spiculated nodule in the right upper lobe due to possible scarring.  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  Patient had an elevated CEA of 6.3 in the past.  She was referred to my office for evaluation with an EGD and a colonoscopy as she was found to have thickening of the sigmoid colon and questionable gastric cardia mass on a CT scan performed at [facility] in September 2022.  However, she did not proceed with the EGD and colonoscopy as she was "fearful of the procedures".  The previously seen gastric mass was considered to be possibly a gastric fold.  The patient had a previously scheduled EGD and colonoscopy on 11/12/2020, but she did not want to proceed with testing at that time.   Assessment/Plan: Symptomatic  anemia -Presented with hemoglobin 6.1 -2 units PRBC transfused -Monitor hemoglobin -Repeat anemia panel   Pancytopenia -Normal spleen on CT abdomen -V56--433 -Folic IRJJ--88.4 -ZYS--0.630   Ptosis/pupillary defect -MR brain--pending   Abnormal gastric thickening/Gastritis -Appreciate GI follow-up -1214 EGD--grade C esophagitis, erythematous nodular because of the stomach, hiatus hernia -protonix and sulcrafate per GI   Bladder Mass -Appreciate urology, Dr. Alyson Ingles, -planning outpatient cystoscopy   Urine retention -600 cc on bladder scan on 12/14>>in/out cath   Failure to thrive -Work-up as above --PT>>SNF -pt refuses SNF     RUL lung nodule -Outpatient PET scan   Severe protein calorie malnutrition -Continue supplements   Hypokalemia/hypomagnesemia -Replete IV and po -still low at 2.7 -Check magnesium--1.4   Hyponatremia -Multifactorial including poor solute intake, volume depletion, and possibly SIADH -Continue IV normal saline for now   Abdominal aortic aneurysm -Measuring 3.9 cm infrarenal -Outpatient surveillance   Candidal intertrigo/cellulitis -Continue treatment with nystatin -continue doxycycline    Deconditioning -PT>>SNF -pt refuses SNF         Family Communication:  spouse updated at bedside 12/15   Consultants:  GI, urology   Code Status:  FULL   DVT Prophylaxis:  SCDs     Procedures: As Listed in Progress Note Above   Antibiotics: Doxy 12/12>>   Total time spent 35 minutes.  Greater than 50% spent face to face counseling and coordinating care.     Subjective: Continues to feel weak.  Patient denies fevers, chills, headache, chest pain, dyspnea, nausea, vomiting, diarrhea,  abdominal pain, dysuria, hematuria, hematochezia, and melena.  Objective: Vitals:   12/18/20 1200 12/18/20 1238 12/18/20 2138 12/19/20 0400  BP: 111/70 119/77 (!) 113/59 117/66  Pulse:  80 88 88  Resp:  17 19 19   Temp: 98 F (36.7 C) 97.7 F  (36.5 C) 98.1 F (36.7 C) 97.6 F (36.4 C)  TempSrc:    Oral  SpO2:  100% 98% 97%  Weight:      Height:        Intake/Output Summary (Last 24 hours) at 12/19/2020 1515 Last data filed at 12/19/2020 1125 Gross per 24 hour  Intake 980 ml  Output 700 ml  Net 280 ml   Weight change:  Exam:  General:  Pt is alert, follows commands appropriately, not in acute distress HEENT: No icterus, No thrush, No neck mass, [place]/AT Cardiovascular: RRR, S1/S2, no rubs, no gallops Respiratory: CTA bilaterally, no wheezing, no crackles, no rhonchi Abdomen: Soft/+BS, non tender, non distended, no guarding Extremities: No edema, No lymphangitis, No petechiae, No rashes, no synovitis   Data Reviewed: I have personally reviewed following labs and imaging studies Basic Metabolic Panel: Recent Labs  Lab 12/16/20 1110 12/17/20 0513 12/18/20 0456 12/19/20 0723  NA 129* 133* 133* 134*  K 2.5* 4.1 3.4* 2.7*  CL 85* 95* 94* 94*  CO2 27 25 25 28   GLUCOSE 94 61* 55* 61*  BUN 10 8 6* <5*  CREATININE 0.60 0.47 0.45 0.46  CALCIUM 8.0* 7.3* 7.1* 7.4*  MG 1.6* 1.9  --  1.4*   Liver Function Tests: Recent Labs  Lab 12/16/20 1110 12/17/20 0513 12/19/20 0723  AST 59* 45* 31  ALT 18 15 15   ALKPHOS 84 63 66  BILITOT 1.6* 1.3* 1.3*  PROT 6.7 5.4* 5.6*  ALBUMIN 2.4* 2.0* 2.2*   No results for input(s): LIPASE, AMYLASE in the last 168 hours. No results for input(s): AMMONIA in the last 168 hours. Coagulation Profile: No results for input(s): INR, PROTIME in the last 168 hours. CBC: Recent Labs  Lab 12/16/20 1110 12/17/20 0513 12/17/20 1655 12/18/20 0456 12/19/20 0723  WBC 5.2 2.7*  --  4.0 3.4*  NEUTROABS 3.9  --   --   --   --   HGB 8.2* 6.1* 9.9* 10.2* 9.4*  HCT 26.8* 20.1* 31.5* 32.0* 30.5*  MCV 89.9 91.0  --  87.7 88.2  PLT 120* 79*  --  90* 75*   Cardiac Enzymes: No results for input(s): CKTOTAL, CKMB, CKMBINDEX, TROPONINI in the last 168 hours. BNP: Invalid input(s):  POCBNP CBG: Recent Labs  Lab 12/18/20 1048 12/18/20 1157  GLUCAP 68* 145*   HbA1C: No results for input(s): HGBA1C in the last 72 hours. Urine analysis:    Component Value Date/Time   COLORURINE YELLOW 12/16/2020 0000   APPEARANCEUR CLEAR 12/16/2020 0000   LABSPEC >1.046 (H) 12/16/2020 0000   PHURINE 6.0 12/16/2020 0000   GLUCOSEU NEGATIVE 12/16/2020 0000   GLUCOSEU NEGATIVE 09/28/2012 1026   HGBUR MODERATE (A) 12/16/2020 0000   BILIRUBINUR NEGATIVE 12/16/2020 0000   KETONESUR 20 (A) 12/16/2020 0000   PROTEINUR NEGATIVE 12/16/2020 0000   UROBILINOGEN 0.2 09/28/2012 1026   NITRITE NEGATIVE 12/16/2020 0000   LEUKOCYTESUR NEGATIVE 12/16/2020 0000   Sepsis Labs: @LABRCNTIP (procalcitonin:4,lacticidven:4) ) Recent Results (from the past 240 hour(s))  Resp Panel by RT-PCR (Flu A&B, Covid) Nasopharyngeal Swab     Status: None   Collection Time: 12/16/20 11:50 AM   Specimen: Nasopharyngeal Swab; Nasopharyngeal(NP) swabs in vial transport medium  Result  Value Ref Range Status   SARS Coronavirus 2 by RT PCR NEGATIVE NEGATIVE Final    Comment: (NOTE) SARS-CoV-2 target nucleic acids are NOT DETECTED.  The SARS-CoV-2 RNA is generally detectable in upper respiratory specimens during the acute phase of infection. The lowest concentration of SARS-CoV-2 viral copies this assay can detect is 138 copies/mL. A negative result does not preclude SARS-Cov-2 infection and should not be used as the sole basis for treatment or other patient management decisions. A negative result may occur with  improper specimen collection/handling, submission of specimen other than nasopharyngeal swab, presence of viral mutation(s) within the areas targeted by this assay, and inadequate number of viral copies(<138 copies/mL). A negative result must be combined with clinical observations, patient history, and epidemiological information. The expected result is Negative.  Fact Sheet for Patients:   EntrepreneurPulse.com.au  Fact Sheet for Healthcare Providers:  IncredibleEmployment.be  This test is no t yet approved or cleared by the [place] FDA and  has been authorized for detection and/or diagnosis of SARS-CoV-2 by FDA under an Emergency Use Authorization (EUA). This EUA will remain  in effect (meaning this test can be used) for the duration of the COVID-19 declaration under Section 564(b)(1) of the Act, 21 U.S.C.section 360bbb-3(b)(1), unless the authorization is terminated  or revoked sooner.       Influenza A by PCR NEGATIVE NEGATIVE Final   Influenza B by PCR NEGATIVE NEGATIVE Final    Comment: (NOTE) The Xpert Xpress SARS-CoV-2/FLU/RSV plus assay is intended as an aid in the diagnosis of influenza from Nasopharyngeal swab specimens and should not be used as a sole basis for treatment. Nasal washings and aspirates are unacceptable for Xpert Xpress SARS-CoV-2/FLU/RSV testing.  Fact Sheet for Patients: EntrepreneurPulse.com.au  Fact Sheet for Healthcare Providers: IncredibleEmployment.be  This test is not yet approved or cleared by the [place] FDA and has been authorized for detection and/or diagnosis of SARS-CoV-2 by FDA under an Emergency Use Authorization (EUA). This EUA will remain in effect (meaning this test can be used) for the duration of the COVID-19 declaration under Section 564(b)(1) of the Act, 21 U.S.C. section 360bbb-3(b)(1), unless the authorization is terminated or revoked.  Performed at [facility], [address], [place], [place] [address]      Scheduled Meds:  feeding supplement  1 Container Oral TID BM   folic acid  1 mg Oral Daily   levothyroxine  112 mcg Oral Q0600   multivitamin with minerals  1 tablet Oral Daily   nystatin  1 application Topical TID   pantoprazole  40 mg Oral BID   sucralfate  1 g Oral TID WC & HS   Continuous Infusions:   doxycycline (VIBRAMYCIN) IV 100 mg (12/19/20 0859)    Procedures/Studies: CT HEAD WO CONTRAST (5MM)  Result Date: 12/17/2020 CLINICAL DATA:  Reduced vision in right eye EXAM: CT HEAD WITHOUT CONTRAST TECHNIQUE: Contiguous axial images were obtained from the base of the skull through the vertex without intravenous contrast. COMPARISON:  09/24/2020 FINDINGS: Brain: No evidence of acute infarction, hemorrhage, hydrocephalus, extra-axial collection or mass lesion/mass effect. Subcortical white matter and periventricular small vessel ischemic changes. Vascular: Intracranial atherosclerosis. Skull: Normal. Negative for fracture or focal lesion. Sinuses/Orbits: The visualized paranasal sinuses are essentially clear. The mastoid air cells are unopacified. Mild proptosis of the bilateral orbits, left greater than right, unchanged. Retroconal soft tissues are within normal limits. Other: None. IMPRESSION: No evidence of acute intracranial abnormality. Small vessel ischemic changes. Mild proptosis of the  bilateral orbits, left greater than right, unchanged. Retroconal soft tissues are within normal limits. Electronically Signed   By: Julian Hy M.D.   On: 12/17/2020 00:35   CT CHEST ABDOMEN PELVIS W CONTRAST  Result Date: 12/16/2020 CLINICAL DATA:  Unintended weight loss with loss of appetite increased weakness over the past several weeks. EXAM: CT CHEST, ABDOMEN, AND PELVIS WITH CONTRAST TECHNIQUE: Multidetector CT imaging of the chest, abdomen and pelvis was performed following the standard protocol during bolus administration of intravenous contrast. CONTRAST:  11mL OMNIPAQUE IOHEXOL 300 MG/ML  SOLN COMPARISON:  CT chest, abdomen, and pelvis dated September 26, 2020. FINDINGS: CT CHEST FINDINGS Cardiovascular: No significant vascular findings. Normal heart size. No pericardial effusion. No thoracic aortic aneurysm or dissection. Coronary, aortic arch, and branch vessel atherosclerotic vascular disease. No  central pulmonary embolism. Mediastinum/Nodes: No enlarged mediastinal, hilar, or axillary lymph nodes. Diminutive or absent thyroid gland, unchanged. Trachea and esophagus demonstrate no significant findings. Lungs/Pleura: Unchanged 1.3 x 0.7 cm spiculated nodular density in the right upper lobe (series 3, image 25). Resolved interstitial infiltrates in the upper lobes and right middle lobe. Unchanged mild paraseptal emphysema in scattered minimally increased subpleural reticulation. No focal consolidation, pleural effusion, or pneumothorax. Musculoskeletal: No chest wall mass or suspicious bone lesions identified. New acute to subacute mild T12 superior endplate compression fracture with minimal height loss. CT ABDOMEN PELVIS FINDINGS Hepatobiliary: No focal liver abnormality is seen. No gallstones, gallbladder wall thickening, or biliary dilatation. Pancreas: Unremarkable. No pancreatic ductal dilatation or surrounding inflammatory changes. Spleen: Normal in size without focal abnormality. Adrenals/Urinary Tract: The adrenal glands and both kidneys are unremarkable. No renal calculi or hydronephrosis. Homogeneously enhancing 2.1 x 1.4 cm right lateral bladder wall mass (series 2, image 105). Stomach/Bowel: Unchanged small hiatal hernia. Previously described possible mass of the gastric cardia represents normal folded gastric mucosa. The small bowel is unremarkable. Extensive sigmoid colonic diverticulosis again noted with chronic long segment wall thickening of the proximal and mid sigmoid colon and mild surrounding fat stranding. 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 History of prior appendectomy. Vascular/Lymphatic: Slightly increased bilobed fusiform aneurysmal dilatation of the infrarenal abdominal aorta measuring up to 3.9 cm (series 4, image 54), previously 3.8 cm (by my measurement). Visceral arteries are patent. Aortoiliac atherosclerotic vascular disease. No enlarged abdominal or  pelvic lymph nodes. Reproductive: Uterus and bilateral adnexa are unremarkable. Other: Unchanged tiny fat containing umbilical hernia. No free fluid or pneumoperitoneum. Musculoskeletal: No acute or significant osseous findings. IMPRESSION: CT chest: 1. Unchanged 1.3 cm spiculated nodular density in the right upper lobe, favor apical scarring. Continued attention on follow-up is recommended. 2. New benign-appearing acute to subacute T12 superior endplate compression fracture. 3. Resolved multifocal pneumonia. 4. Aortic Atherosclerosis (ICD10-I70.0) and Emphysema (ICD10-J43.9). CT abdomen pelvis: 1. 2.1 cm right lateral bladder wall mass, concerning for primary bladder carcinoma. Urine cytology and cystoscopy are recommended for further evaluation. 2. Slightly increased bilobed fusiform aneurysmal dilatation of the infrarenal abdominal aorta measuring up to 3.9 cm, previously 3.8 cm. Recommend follow-up every 2 years. This recommendation follows ACR consensus guidelines: White Paper of the ACR Incidental Findings Committee II on Vascular Findings. J Am Coll Radiol 2013; 10:789-794. 3. 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 Electronically Signed   By: Titus Dubin M.D.   On: 12/16/2020 13:36    Orson Eva, DO  [facility]  If 7PM-7AM, please contact night-coverage [email or web address] Password [email or web address] 12/19/2020, 3:15 PM   LOS: 2 days

## 2020-12-20 ENCOUNTER — Inpatient Hospital Stay (HOSPITAL_COMMUNITY): Payer: PPO

## 2020-12-20 ENCOUNTER — Telehealth: Payer: Self-pay | Admitting: Family Medicine

## 2020-12-20 DIAGNOSIS — I639 Cerebral infarction, unspecified: Secondary | ICD-10-CM

## 2020-12-20 LAB — BASIC METABOLIC PANEL
Anion gap: 12 (ref 5–15)
BUN: <5 mg/dL — ABNORMAL LOW (ref 8–23)
CO2: 26 mmol/L (ref 22–32)
Calcium: 7.5 mg/dL — ABNORMAL LOW (ref 8.9–10.3)
Chloride: 96 mmol/L — ABNORMAL LOW (ref 98–111)
Creatinine, Ser: 0.49 mg/dL (ref 0.44–1.00)
GFR, Estimated: >60 mL/min (ref >60)
Glucose, Bld: 65 mg/dL — ABNORMAL LOW (ref 70–99)
Potassium: 2.7 mmol/L — CL (ref 3.5–5.1)
Sodium: 134 mmol/L — ABNORMAL LOW (ref 135–145)

## 2020-12-20 LAB — CBC
HCT: 29.9 % — ABNORMAL LOW (ref 36.0–46.0)
Hemoglobin: 9 g/dL — ABNORMAL LOW (ref 12.0–15.0)
MCH: 26.5 pg (ref 26.0–34.0)
MCHC: 30.1 g/dL (ref 30.0–36.0)
MCV: 87.9 fL (ref 80.0–100.0)
Platelets: 72 10*3/uL — ABNORMAL LOW (ref 150–400)
RBC: 3.4 MIL/uL — ABNORMAL LOW (ref 3.87–5.11)
RDW: 15.7 % — ABNORMAL HIGH (ref 11.5–15.5)
WBC: 3.6 10*3/uL — ABNORMAL LOW (ref 4.0–10.5)
nRBC: 0 % (ref 0.0–0.2)

## 2020-12-20 LAB — LIPID PANEL
Cholesterol: 85 mg/dL (ref 0–200)
HDL: 33 mg/dL — ABNORMAL LOW (ref >40)
LDL Cholesterol: 39 mg/dL (ref 0–99)
Total CHOL/HDL Ratio: 2.6 RATIO
Triglycerides: 63 mg/dL (ref <150)
VLDL: 13 mg/dL (ref 0–40)

## 2020-12-20 LAB — MAGNESIUM: Magnesium: 1.7 mg/dL (ref 1.7–2.4)

## 2020-12-20 LAB — HEMOGLOBIN A1C
Hgb A1c MFr Bld: 4.7 % — ABNORMAL LOW (ref 4.8–5.6)
Mean Plasma Glucose: 88.19 mg/dL

## 2020-12-20 LAB — SURGICAL PATHOLOGY

## 2020-12-20 MED ORDER — CHLORHEXIDINE GLUCONATE CLOTH 2 % EX PADS
6.0000 | MEDICATED_PAD | Freq: Every day | CUTANEOUS | Status: DC
  Administered 2020-12-20: 6 via TOPICAL

## 2020-12-20 MED ORDER — MAGNESIUM SULFATE 2 GM/50ML IV SOLN
2.0000 g | Freq: Once | INTRAVENOUS | Status: AC
  Administered 2020-12-20: 2 g via INTRAVENOUS
  Filled 2020-12-20: qty 50

## 2020-12-20 MED ORDER — POTASSIUM CHLORIDE CRYS ER 20 MEQ PO TBCR
40.0000 meq | EXTENDED_RELEASE_TABLET | ORAL | Status: AC
  Administered 2020-12-20 (×2): 40 meq via ORAL
  Filled 2020-12-20 (×2): qty 2

## 2020-12-20 MED ORDER — IOHEXOL 350 MG/ML SOLN
75.0000 mL | Freq: Once | INTRAVENOUS | Status: AC | PRN
  Administered 2020-12-20: 75 mL via INTRAVENOUS

## 2020-12-20 MED ORDER — STROKE: EARLY STAGES OF RECOVERY BOOK: Freq: Once | Status: AC

## 2020-12-20 MED ORDER — ASPIRIN 81 MG PO CHEW
81.0000 mg | CHEWABLE_TABLET | Freq: Every day | ORAL | Status: DC
  Administered 2020-12-20: 81 mg via ORAL
  Filled 2020-12-20: qty 1

## 2020-12-20 MED ORDER — POTASSIUM CHLORIDE 10 MEQ/100ML IV SOLN
10.0000 meq | INTRAVENOUS | Status: AC
  Administered 2020-12-20 (×3): 10 meq via INTRAVENOUS
  Filled 2020-12-20 (×3): qty 100

## 2020-12-20 NOTE — Consult Note (Signed)
I connected with  Olivia Blanchard on 12/20/20 by a video enabled telemedicine application and verified that I am speaking with the correct person using two identifiers.   I discussed the limitations of evaluation and management by telemedicine. The patient expressed understanding and agreed to proceed.  Location of patient: [facility] Location of physician: [facility]   Neurology Consultation Reason for Consult: stroke Referring Physician: Dr Shanon Brow Tat  CC: weakness, vomiting  History is obtained from: patient, chart review  HPI: Olivia Blanchard is a 70 y.o. female with past medical history of abdominal aortic aneurysm, Graves' disease status post radiation therapy, hypertension and stomach mass who presented to emergency room on 12/16/2020 for weakness, vomiting.  CT chest Abdo pelvis was done which showed 2.1 cm bladder mass concerning for bladder carcinoma as well as spiculated nodule in the right upper lobe which could be due to scarring.  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    GI was consulted and per their assessment, the previously seen gastric mass was considered to be possibly a gastric fold.  She did have an elevated CEA of 6.88. EGD was performed on 12/18/2020, showed LA grade C esophagitis.  Recommend twice daily pantoprazole 40 mg for 12 weeks, Carafate 1 g 4 times daily upon discharge.  GI plans for repeat upper endoscopy in 3 months as well as colonoscopy.   Urology was consulted for bladder mass and recommended cystoscopy with transurethral resection.  Patient also reported blurriness in right eye for about 4 months as well as mild droop in the right eyelid.  MRI brain was performed which showed multiple strokes concerning for cardioembolic etiology.  Therefore neurology was consulted.  Patient states that she has had blurred vision in right eye for last few months as well as ptosis, denies any  diplopia, fluctuation in visual acuity, dysphagia, shortness of breath, weakness.  Does report some dysarthria when she is tired.  States she has not had her vision checked and thinks her blurred vision is because of decreased visual acuity.   ROS: All other systems reviewed and negative except as noted in the HPI.  Past Medical History:  Diagnosis Date   Grave's disease    HYPERTENSION 09/30/2007   HYPOTHYROIDISM 09/30/2007   Tobacco abuse     Family History  Problem Relation Age of Onset   Diabetes Mother    Thyroid disease Mother    Cancer Neg Hx        sister s- lung Ca, copd    Social History:  reports that she quit smoking about 2 months ago. Her smoking use included cigarettes. She smoked an average of 1 pack per day. She has never used smokeless tobacco. She reports that she does not drink alcohol and does not use drugs.   Medications Prior to Admission  Medication Sig Dispense Refill Last Dose   ALPRAZolam (XANAX) 0.25 MG tablet TAKE 1 TABLET BY MOUTH TWICE DAILY AS NEEDED FOR ANXIETY (Patient taking differently: Take 0.25 mg by mouth 2 (two) times daily as needed for anxiety.) 60 tablet 0 60/45/4098   folic acid (FOLVITE) 1 MG tablet Take 1 tablet (1 mg total) by mouth daily. 30 tablet 6 12/16/2020   levothyroxine (SYNTHROID) 112 MCG tablet TAKE 1 TABLET BY MOUTH DAILY (Patient taking differently: Take 112 mcg by mouth daily at 6 (six) AM.) 90 tablet 1 12/16/2020   potassium chloride (KLOR-CON) 10 MEQ tablet Take 1  tablet PO BID x 7 days, then take 1 tablet PO daily (Patient taking differently: Take 10 mEq by mouth daily.) 90 tablet 0 12/16/2020   prochlorperazine (COMPAZINE) 10 MG tablet Take 1 tablet (10 mg total) by mouth every 6 (six) hours as needed for nausea or vomiting. 30 tablet 0 PRN   famotidine (PEPCID) 20 MG tablet Take 20 mg by mouth daily. (Patient not taking: Reported on 12/16/2020)   Not Taking   metoprolol succinate (TOPROL-XL) 50 MG 24 hr tablet Take 1  tablet (50 mg total) by mouth daily. (Patient not taking: Reported on 12/16/2020) 90 tablet 0 Not Taking   polyethylene glycol-electrolytes (TRILYTE) 420 g solution Take 4,000 mLs by mouth as directed. (Patient not taking: Reported on 12/16/2020) 4000 mL 0 Completed Course      Exam: Current vital signs: BP (!) 107/53 (BP Location: Left Arm)    Pulse 91    Temp 98.1 F (36.7 C)    Resp 19    Ht 5' 2"  (1.575 m)    Wt 62.9 kg    SpO2 100%    BMI 25.37 kg/m  Vital signs in last 24 hours: Temp:  [97.5 F (36.4 C)-98.1 F (36.7 C)] 98.1 F (36.7 C) (12/16 0558) Pulse Rate:  [86-91] 91 (12/16 0558) Resp:  [19] 19 (12/16 0558) BP: (101-107)/(53-58) 107/53 (12/16 0558) SpO2:  [98 %-100 %] 100 % (12/16 0558)   Physical Exam  Constitutional: Appears well-developed and well-nourished.  Psych: Affect appropriate to situation Eyes: No scleral injection Respiratory: Effort normal, non-labored breathing Neuro: Awake, alert, oriented to place and person, follows commands, no evidence of aphasia or dysarthria, both pupils equal and reacting to light per RN at bedside (difficult to assess on tele), right eye ptosis, extraocular movements appear intact, no apparent facial asymmetry, antigravity strength in all 4 extremities without drift, FTN intact bilaterally   I have reviewed labs in epic and the results pertinent to this consultation are: CBC:  Recent Labs  Lab 12/16/20 1110 12/17/20 0513 12/19/20 0723 12/20/20 0617  WBC 5.2   < > 3.4* 3.6*  NEUTROABS 3.9  --   --   --   HGB 8.2*   < > 9.4* 9.0*  HCT 26.8*   < > 30.5* 29.9*  MCV 89.9   < > 88.2 87.9  PLT 120*   < > 75* 72*   < > = values in this interval not displayed.    Basic Metabolic Panel:  Lab Results  Component Value Date   NA 134 (L) 12/20/2020   K 2.7 (LL) 12/20/2020   CO2 26 12/20/2020   GLUCOSE 65 (L) 12/20/2020   BUN <5 (L) 12/20/2020   CREATININE 0.49 12/20/2020   CALCIUM 7.5 (L) 12/20/2020   GFRNONAA >60  12/20/2020   GFRAA  02/26/2010    >60        The eGFR has been calculated using the MDRD equation. This calculation has not been validated in all clinical situations. eGFR's persistently <60 mL/min signify possible Chronic Kidney Disease.   Lipid Panel:  Lab Results  Component Value Date   LDLCALC 39 12/20/2020   HgbA1c: No results found for: HGBA1C Urine Drug Screen: No results found for: LABOPIA, COCAINSCRNUR, LABBENZ, AMPHETMU, THCU, LABBARB  Alcohol Level     Component Value Date/Time   ETH <10 12/16/2020 1110     I have reviewed the images obtained:  MRI brain with and without contrast 12/19/2020: 12/19/2020: Multiple small areas of infarct in  both cerebral hemispheres and cerebellum bilaterally. Findings suggests multiple emboli. Negative for metastatic disease.      ASSESSMENT/PLAN: 70 year old female who presented with weakness, vomiting, 50 pound weight loss and found to have a bladder mass as well as embolic strokes on MRI  Acute ischemic stroke (incidental) Thrombocytopenia Right carotid stenosis Right eye ptosis  Right eye blurred vision -Suspected cardioembolic versus hypercoagulable state in the setting of malignancy -Carotid ultrasound showed heterogeneous and partially calcified plaque at the right carotid bifurcation contributes to 70%-99% stenosis by established duplex criteria. Minimal heterogeneous and calcified plaque,with no hemodynamically significant stenosis in left carotid by duplex criteria in the extracranial cerebrovascular circulation.  Recommendations -Patient's platelets are already low and have been slowly trending down.  Would recommend starting aspirin 81 mg daily for now.  However, if platelets drop below 50, recommend stopping aspirin -Patient's LDL is 39 (less than 70) and patient was not on statin prior admission.  Therefore, not starting statin -We will obtain CTA head and neck for further evaluation of intracranial stenosis  although patient will likely not be a candidate for dual antiplatelets due to thrombocytopenia -If CTA head and neck are normal, recommend 30-day event monitor at time of discharge -Recommend follow-up with neurology in 2 to 4 weeks for further assessment of right eye ptosis and blurred vision -Follow-up with interventional neurology for right carotid stenosis -Management of rest of comorbidities per primary team  Thank you for allowing [place] to participate in the care of this patient. If you have any further questions, please contact  me or neurohospitalist.   [facility]

## 2020-12-20 NOTE — Telephone Encounter (Signed)
ok 

## 2020-12-20 NOTE — Progress Notes (Signed)
Date and time results received: 12/20/20 0720 (use smartphrase ".now" to insert current time)  Test: potassium Critical Value: 2.7  Name of Provider Notified: Tat  Orders Received? Or Actions Taken?: awaiting response

## 2020-12-20 NOTE — Discharge Summary (Signed)
Physician Discharge Summary  Olivia Blanchard SKA:[id number] DOB: 1950/10/29 DOA: 12/16/2020  PCP: Caren Macadam, MD  Admit date: 12/16/2020 Discharge date: 12/20/2020  Admitted From: Home Disposition:  Home with Hospice       Discharge Condition: Stable CODE STATUS: FULL COMFORT Diet recommendation:  Regular   Brief/Interim Summary: 70 year old female with a history of Graves' disease status postradiation,, tension, tobacco abuse, abdominal aortic aneurysm presenting with 2 to 3 months of generalized weakness, decreased oral intake, weight loss, and nausea. She denies any fevers, chills, chest pain, abdominal pain, diarrhea, hematochezia, melena.  She has had increasing generalized weakness and fatigue.  She has had a 50 pound weight loss in September 2022.  In the ED, she was hemodynamically stable.  Labs on admission were remarkable for low potassium 2.5, sodium 129, chloride 85, mildly elevated AST of 59 with normal ALT of 18, albumin was low 2.4, total bili was mildly elevated 1.6, alkaline phosphatase was normal at 84, CBC showed a hemoglobin of 8.2 we will also count 5.2 and platelets of 120.  Repeat hemoglobin today dropped down to 6.1.  Patient is currently receiving 1 unit of PRBC.  CT of the chest, abdomen and pelvis with IV contrast showed a 2.1 cm bladder mass concerning for a bladder carcinoma and presence of a spiculated nodule in the right upper lobe due to possible scarring.  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  Patient had an elevated CEA of 6.3 in the past.  She was referred to my office for evaluation with an EGD and a colonoscopy as she was found to have thickening of the sigmoid colon and questionable gastric cardia mass on a CT scan performed at [facility] in September 2022.  However, she did not proceed with the EGD and colonoscopy as she was "fearful of the procedures".  The previously seen  gastric mass was considered to be possibly a gastric fold.  The patient had a previously scheduled EGD and colonoscopy on 11/12/2020, but she did not want to proceed with testing at that time.  -on 12/15 and 12/16, I had [place] discussions with patient and spouse -pt expressed "I'm tired of all the testing and all the medications.  I don't want any chemo even if I have cancer.  I don't want any surgery or any procedures" -pt and spouse expressed that they wanted to go home with hospice care  -Advance care planning, including the explanation and discussion of advance directives was carried out with the patient and family.  Code status including explanations of "Full Code" and "DNR" and alternatives were discussed in detail.  Discussion of end-of-life issues including but not limited palliative care, hospice care and the concept of hospice, other end-of-life care options, power of attorney for health care decisions, living wills, and physician orders for life-sustaining treatment were also discussed with the patient and family.  Total face to face time 26 minutes. -anticipatory guidance was provided for patient and spouse -They expressed understanding that meds with any curative intent will be discontinued--this is what they want--they wanted to focus on comfort and dignity -social work assisted in setting up home hospice care with [employer] hospice Discharge Diagnoses:  Symptomatic anemia -Presented with hemoglobin 6.1 -2 units PRBC transfused this admission -Monitor hemoglobin -B12--708 -folate 10.7 -iron sat 97%, ferritin 548   Pancytopenia -Normal spleen on CT abdomen -O03--559 -Folic RCBU--38.4 -TXM--4.680   Acute nonhemorrhagic stroke -MR brain--Multiple small areas of infarct  in both cerebral hemispheres and cerebellum bilaterally -appreciate neurology consult -start ASA 81 mg daily -High-grade stenosis proximal right internal carotid artery due to a short segment web; no  LVO -A1C--6.7 -LDL--39 -PT>>SNF -pt refuses SNF -pt does not want 30-day event monitor   Abnormal gastric thickening/Gastritis -Appreciate GI follow-up -1214 EGD--grade C esophagitis, erythematous nodular because of the stomach, hiatus hernia -protonix and sulcrafate per GI   Bladder Mass -Appreciate urology, Dr. Alyson Ingles, -planning outpatient cystoscopy   Urine retention -foley catheter inserted    Failure to thrive -Work-up as above --PT>>SNF -pt refuses SNF     RUL lung nodule -Outpatient PET scan   Severe protein calorie malnutrition -Continue supplements   Hypokalemia/hypomagnesemia -Replete IV and po -still low at 2.7>>continue repletion -Check magnesium--1.4>>1.7   Hyponatremia -Multifactorial including poor solute intake, volume depletion, and possibly SIADH -Continue IV normal saline for now   Abdominal aortic aneurysm -Measuring 3.9 cm infrarenal -Outpatient surveillance   Candidal intertrigo/cellulitis -Continue treatment with nystatin -continue doxycycline    Deconditioning -PT>>SNF -pt refuses SNF  Goals of Care -on 12/15 and 12/16, I had [place] discussions with patient and spouse -pt expressed "I'm tired of all the testing and all the medications.  I don't want any chemo even if I have cancer.  I don't want any surgery or any procedures" -pt and spouse expressed that they wanted to go home with hospice care  -Advance care planning, including the explanation and discussion of advance directives was carried out with the patient and family.  Code status including explanations of "Full Code" and "DNR" and alternatives were discussed in detail.  Discussion of end-of-life issues including but not limited palliative care, hospice care and the concept of hospice, other end-of-life care options, power of attorney for health care decisions, living wills, and physician orders for life-sustaining treatment were also discussed with the patient and family.  Total  face to face time 26 minutes. -anticipatory guidance was provided for patient and spouse -They expressed understanding that meds with any curative intent will be discontinued--this is what they want--they wanted to focus on comfort and dignity -social work assisted in setting up home hospice care with [facility]      Discharge Instructions   Allergies as of 12/20/2020       Reactions   Codeine Phosphate    Lidocaine Palpitations   "stuff the dentist uses to numb your teeth" causes tachycardia.   Prednisone Palpitations   tachycardia        Medication List     STOP taking these medications    famotidine 20 MG tablet Commonly known as: PEPCID   folic acid 1 MG tablet Commonly known as: FOLVITE   metoprolol succinate 50 MG 24 hr tablet Commonly known as: TOPROL-XL   polyethylene glycol-electrolytes 420 g solution Commonly known as: TriLyte   potassium chloride 10 MEQ tablet Commonly known as: KLOR-CON M       TAKE these medications    ALPRAZolam 0.25 MG tablet Commonly known as: XANAX TAKE 1 TABLET BY MOUTH TWICE DAILY AS NEEDED FOR ANXIETY What changed:  reasons to take this additional instructions   levothyroxine 112 MCG tablet Commonly known as: SYNTHROID TAKE 1 TABLET BY MOUTH DAILY What changed: when to take this   prochlorperazine 10 MG tablet Commonly known as: COMPAZINE Take 1 tablet (10 mg total) by mouth every 6 (six) hours as needed for nausea or vomiting.        Follow-up Information  [facility].   Specialty: Hospice Why: Home Hospice Contact information: [address] [place] [address] [phone]               Allergies  Allergen Reactions   Codeine Phosphate    Lidocaine Palpitations    "stuff the dentist uses to numb your teeth" causes tachycardia.   Prednisone Palpitations    tachycardia     Consultations: GI neurology   Procedures/Studies: CT ANGIO HEAD W OR WO CONTRAST  Result Date: 12/20/2020 CLINICAL DATA:  Stroke. Right eyelid droop and blurred vision for months. History of bladder cancer. EXAM: CT ANGIOGRAPHY HEAD AND NECK TECHNIQUE: Multidetector CT imaging of the head and neck was performed using the standard protocol during bolus administration of intravenous contrast. Multiplanar CT image reconstructions and MIPs were obtained to evaluate the vascular anatomy. Carotid stenosis measurements (when applicable) are obtained utilizing NASCET criteria, using the distal internal carotid diameter as the denominator. CONTRAST:  45mL OMNIPAQUE IOHEXOL 350 MG/ML SOLN COMPARISON:  CT head 12/17/2020.  MRI 12/19/2020 FINDINGS: CT HEAD FINDINGS Brain: Multiple small areas of acute infarct bilaterally are best seen on diffusion-weighted imaging and difficult to see by CT. Generalized atrophy.  No acute hemorrhage or mass. Vascular: Negative for hyperdense vessel Skull: Negative Sinuses: Negative Orbits: Negative Review of the MIP images confirms the above findings CTA NECK FINDINGS Aortic arch: Standard branching. Imaged portion shows no evidence of aneurysm or dissection. No significant stenosis of the major arch vessel origins. Atherosclerotic calcification in the aortic arch. Right carotid system: Mild atherosclerotic calcification right carotid bulb. Just above this area is a thin soft tissue web causing severe stenosis of the internal carotid artery. On sagittal image 65, there appears to be a very small patent lumen through the web. Estimated 90% diameter stenosis. Left carotid system: Atherosclerotic calcification left carotid bulb without significant stenosis. Vertebral arteries: Both vertebral arteries are widely patent to the skull base without stenosis. Skeleton: No acute skeletal abnormality. Other neck: Negative for mass or adenopathy. No thyroid tissue. Correlate with surgical  history. Upper chest: Apical emphysema. 11 mm spiculated nodule right apex extending to the pleural surface. No change from prior chest CT 09/26/2020 Review of the MIP images confirms the above findings CTA HEAD FINDINGS Anterior circulation: Atherosclerotic calcification in the cavernous carotid bilaterally causing mild stenosis bilaterally. Anterior and middle cerebral arteries patent bilaterally without stenosis. Posterior circulation: Both vertebral arteries patent to the basilar. PICA patent bilaterally. Basilar widely patent. Superior cerebellar and posterior cerebral arteries patent bilaterally. Fetal origin posterior cerebral artery bilaterally. Venous sinuses: Normal venous enhancement. Anatomic variants: None Review of the MIP images confirms the above findings IMPRESSION: 1. Multiple small areas of acute infarct identified on diffusion-weighted imaging yesterday. These are not identified by CT. No acute hemorrhage. 2. High-grade stenosis proximal right internal carotid artery due to a short segment web. Just below the web there is atherosclerotic calcification not causing significant stenosis 3. No significant left carotid stenosis. Both vertebral arteries widely patent 4. Negative for intracranial stenosis or large vessel occlusion. Electronically Signed   By: Franchot Gallo M.D.   On: 12/20/2020 13:20   CT HEAD WO CONTRAST (5MM)  Result Date: 12/17/2020 CLINICAL DATA:  Reduced vision in right eye EXAM: CT HEAD WITHOUT CONTRAST TECHNIQUE: Contiguous axial images were obtained from the base of the skull through the vertex without intravenous contrast. COMPARISON:  09/24/2020 FINDINGS: Brain: No evidence of acute infarction, hemorrhage, hydrocephalus, extra-axial collection or  mass lesion/mass effect. Subcortical white matter and periventricular small vessel ischemic changes. Vascular: Intracranial atherosclerosis. Skull: Normal. Negative for fracture or focal lesion. Sinuses/Orbits: The visualized  paranasal sinuses are essentially clear. The mastoid air cells are unopacified. Mild proptosis of the bilateral orbits, left greater than right, unchanged. Retroconal soft tissues are within normal limits. Other: None. IMPRESSION: No evidence of acute intracranial abnormality. Small vessel ischemic changes. Mild proptosis of the bilateral orbits, left greater than right, unchanged. Retroconal soft tissues are within normal limits. Electronically Signed   By: Julian Hy M.D.   On: 12/17/2020 00:35   CT ANGIO NECK W OR WO CONTRAST  Result Date: 12/20/2020 CLINICAL DATA:  Stroke. Right eyelid droop and blurred vision for months. History of bladder cancer. EXAM: CT ANGIOGRAPHY HEAD AND NECK TECHNIQUE: Multidetector CT imaging of the head and neck was performed using the standard protocol during bolus administration of intravenous contrast. Multiplanar CT image reconstructions and MIPs were obtained to evaluate the vascular anatomy. Carotid stenosis measurements (when applicable) are obtained utilizing NASCET criteria, using the distal internal carotid diameter as the denominator. CONTRAST:  2mL OMNIPAQUE IOHEXOL 350 MG/ML SOLN COMPARISON:  CT head 12/17/2020.  MRI 12/19/2020 FINDINGS: CT HEAD FINDINGS Brain: Multiple small areas of acute infarct bilaterally are best seen on diffusion-weighted imaging and difficult to see by CT. Generalized atrophy.  No acute hemorrhage or mass. Vascular: Negative for hyperdense vessel Skull: Negative Sinuses: Negative Orbits: Negative Review of the MIP images confirms the above findings CTA NECK FINDINGS Aortic arch: Standard branching. Imaged portion shows no evidence of aneurysm or dissection. No significant stenosis of the major arch vessel origins. Atherosclerotic calcification in the aortic arch. Right carotid system: Mild atherosclerotic calcification right carotid bulb. Just above this area is a thin soft tissue web causing severe stenosis of the internal carotid  artery. On sagittal image 65, there appears to be a very small patent lumen through the web. Estimated 90% diameter stenosis. Left carotid system: Atherosclerotic calcification left carotid bulb without significant stenosis. Vertebral arteries: Both vertebral arteries are widely patent to the skull base without stenosis. Skeleton: No acute skeletal abnormality. Other neck: Negative for mass or adenopathy. No thyroid tissue. Correlate with surgical history. Upper chest: Apical emphysema. 11 mm spiculated nodule right apex extending to the pleural surface. No change from prior chest CT 09/26/2020 Review of the MIP images confirms the above findings CTA HEAD FINDINGS Anterior circulation: Atherosclerotic calcification in the cavernous carotid bilaterally causing mild stenosis bilaterally. Anterior and middle cerebral arteries patent bilaterally without stenosis. Posterior circulation: Both vertebral arteries patent to the basilar. PICA patent bilaterally. Basilar widely patent. Superior cerebellar and posterior cerebral arteries patent bilaterally. Fetal origin posterior cerebral artery bilaterally. Venous sinuses: Normal venous enhancement. Anatomic variants: None Review of the MIP images confirms the above findings IMPRESSION: 1. Multiple small areas of acute infarct identified on diffusion-weighted imaging yesterday. These are not identified by CT. No acute hemorrhage. 2. High-grade stenosis proximal right internal carotid artery due to a short segment web. Just below the web there is atherosclerotic calcification not causing significant stenosis 3. No significant left carotid stenosis. Both vertebral arteries widely patent 4. Negative for intracranial stenosis or large vessel occlusion. Electronically Signed   By: Franchot Gallo M.D.   On: 12/20/2020 13:20   MR BRAIN W WO CONTRAST  Result Date: 12/19/2020 CLINICAL DATA:  Acute neuro deficit.  Suspect brain metastasis. EXAM: MRI HEAD WITHOUT AND WITH CONTRAST  TECHNIQUE: Multiplanar, multiecho pulse sequences of the  brain and surrounding structures were obtained without and with intravenous contrast. CONTRAST:  102mL GADAVIST GADOBUTROL 1 MMOL/ML IV SOLN COMPARISON:  CT head 12/17/2020 FINDINGS: Brain: Multiple small areas of restricted diffusion are present. These are present in both cerebral hemispheres, right greater than left. Small areas of restricted diffusion also in the cerebellum bilaterally. These areas do not show abnormal enhancement. No hemorrhage. Ventricle size normal.  No midline shift. Vascular: Normal arterial flow voids at the skull base. Skull and upper cervical spine: Negative Sinuses/Orbits: Paranasal sinuses clear. Left mastoid effusion. Bilateral cataract extraction Other: None IMPRESSION: Multiple small areas of infarct in both cerebral hemispheres and cerebellum bilaterally. Findings suggests multiple emboli Negative for metastatic disease. Electronically Signed   By: Franchot Gallo M.D.   On: 12/19/2020 16:08   CT CHEST ABDOMEN PELVIS W CONTRAST  Result Date: 12/16/2020 CLINICAL DATA:  Unintended weight loss with loss of appetite increased weakness over the past several weeks. EXAM: CT CHEST, ABDOMEN, AND PELVIS WITH CONTRAST TECHNIQUE: Multidetector CT imaging of the chest, abdomen and pelvis was performed following the standard protocol during bolus administration of intravenous contrast. CONTRAST:  47mL OMNIPAQUE IOHEXOL 300 MG/ML  SOLN COMPARISON:  CT chest, abdomen, and pelvis dated September 26, 2020. FINDINGS: CT CHEST FINDINGS Cardiovascular: No significant vascular findings. Normal heart size. No pericardial effusion. No thoracic aortic aneurysm or dissection. Coronary, aortic arch, and branch vessel atherosclerotic vascular disease. No central pulmonary embolism. Mediastinum/Nodes: No enlarged mediastinal, hilar, or axillary lymph nodes. Diminutive or absent thyroid gland, unchanged. Trachea and esophagus demonstrate no significant  findings. Lungs/Pleura: Unchanged 1.3 x 0.7 cm spiculated nodular density in the right upper lobe (series 3, image 25). Resolved interstitial infiltrates in the upper lobes and right middle lobe. Unchanged mild paraseptal emphysema in scattered minimally increased subpleural reticulation. No focal consolidation, pleural effusion, or pneumothorax. Musculoskeletal: No chest wall mass or suspicious bone lesions identified. New acute to subacute mild T12 superior endplate compression fracture with minimal height loss. CT ABDOMEN PELVIS FINDINGS Hepatobiliary: No focal liver abnormality is seen. No gallstones, gallbladder wall thickening, or biliary dilatation. Pancreas: Unremarkable. No pancreatic ductal dilatation or surrounding inflammatory changes. Spleen: Normal in size without focal abnormality. Adrenals/Urinary Tract: The adrenal glands and both kidneys are unremarkable. No renal calculi or hydronephrosis. Homogeneously enhancing 2.1 x 1.4 cm right lateral bladder wall mass (series 2, image 105). Stomach/Bowel: Unchanged small hiatal hernia. Previously described possible mass of the gastric cardia represents normal folded gastric mucosa. The small bowel is unremarkable. Extensive sigmoid colonic diverticulosis again noted with chronic long segment wall thickening of the proximal and mid sigmoid colon and mild surrounding fat stranding. 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 History of prior appendectomy. Vascular/Lymphatic: Slightly increased bilobed fusiform aneurysmal dilatation of the infrarenal abdominal aorta measuring up to 3.9 cm (series 4, image 54), previously 3.8 cm (by my measurement). Visceral arteries are patent. Aortoiliac atherosclerotic vascular disease. No enlarged abdominal or pelvic lymph nodes. Reproductive: Uterus and bilateral adnexa are unremarkable. Other: Unchanged tiny fat containing umbilical hernia. No free fluid or pneumoperitoneum. Musculoskeletal: No acute or  significant osseous findings. IMPRESSION: CT chest: 1. Unchanged 1.3 cm spiculated nodular density in the right upper lobe, favor apical scarring. Continued attention on follow-up is recommended. 2. New benign-appearing acute to subacute T12 superior endplate compression fracture. 3. Resolved multifocal pneumonia. 4. Aortic Atherosclerosis (ICD10-I70.0) and Emphysema (ICD10-J43.9). CT abdomen pelvis: 1. 2.1 cm right lateral bladder wall mass, concerning for primary bladder carcinoma. Urine cytology and cystoscopy  are recommended for further evaluation. 2. Slightly increased bilobed fusiform aneurysmal dilatation of the infrarenal abdominal aorta measuring up to 3.9 cm, previously 3.8 cm. Recommend follow-up every 2 years. This recommendation follows ACR consensus guidelines: White Paper of the ACR Incidental Findings Committee II on Vascular Findings. J Am Coll Radiol 2013; 10:789-794. 3. 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 Electronically Signed   By: Titus Dubin M.D.   On: 12/16/2020 13:36   US Carotid Bilateral  Result Date: 12/20/2020 CLINICAL DATA:  70 year old female with a history stroke EXAM: BILATERAL CAROTID DUPLEX ULTRASOUND TECHNIQUE: Pearline Cables scale imaging, color Doppler and duplex ultrasound were performed of bilateral carotid and vertebral arteries in the neck. COMPARISON:  No prior duplex FINDINGS: Criteria: Quantification of carotid stenosis is based on velocity parameters that correlate the residual internal carotid diameter with NASCET-based stenosis levels, using the diameter of the distal internal carotid lumen as the denominator for stenosis measurement. The following velocity measurements were obtained: RIGHT ICA:  Systolic 240 cm/sec, Diastolic 79 cm/sec CCA:  57 cm/sec SYSTOLIC ICA/CCA RATIO:  4.5 ECA:  121 cm/sec LEFT ICA:  Systolic 82 cm/sec, Diastolic 25 cm/sec CCA:  973 cm/sec SYSTOLIC ICA/CCA RATIO:  0.7 ECA:  71 cm/sec Right Brachial  SBP: Not acquired Left Brachial SBP: Not acquired RIGHT CAROTID ARTERY: No significant calcifications of the right common carotid artery. Intermediate waveform maintained. Heterogeneous and partially calcified plaque at the right carotid bifurcation. No significant lumen shadowing. Low resistance waveform of the right ICA. No significant tortuosity. RIGHT VERTEBRAL ARTERY: Antegrade flow with low resistance waveform. LEFT CAROTID ARTERY: No significant calcifications of the left common carotid artery. Intermediate waveform maintained. Heterogeneous and partially calcified plaque at the left carotid bifurcation without significant lumen shadowing. Low resistance waveform of the left ICA. No significant tortuosity. LEFT VERTEBRAL ARTERY:  Antegrade flow with low resistance waveform. IMPRESSION: Right: Heterogeneous and partially calcified plaque at the right carotid bifurcation contributes to 70%-99% stenosis by established duplex criteria. Left: Color duplex indicates minimal heterogeneous and calcified plaque, with no hemodynamically significant stenosis by duplex criteria in the extracranial cerebrovascular circulation. Signed, Dulcy Fanny. Dellia Nims, RPVI [facility] Radiology Electronically Signed   By: Corrie Mckusick D.O.   On: 12/20/2020 11:35        Discharge Exam: Vitals:   12/20/20 0558 12/20/20 1300  BP: (!) 107/53 121/72  Pulse: 91 (!) 110  Resp: 19 19  Temp: 98.1 F (36.7 C) 98.5 F (36.9 C)  SpO2: 100%    Vitals:   12/19/20 1619 12/19/20 2100 12/20/20 0558 12/20/20 1300  BP: (!) 101/58 (!) 103/58 (!) 107/53 121/72  Pulse: 91 86 91 (!) 110  Resp:  19 19 19   Temp: 97.9 F (36.6 C) (!) 97.5 F (36.4 C) 98.1 F (36.7 C) 98.5 F (36.9 C)  TempSrc: Oral Oral  Oral  SpO2: 100% 98% 100%   Weight:      Height:        General: Pt is alert, awake, not in acute distress Cardiovascular: RRR, S1/S2 +, no rubs, no gallops Respiratory: CTA  bilaterally, no wheezing, no rhonchi Abdominal: Soft, NT, ND, bowel sounds + Extremities: no edema, no cyanosis   The results of significant diagnostics from this hospitalization (including imaging, microbiology, ancillary and laboratory) are listed below for reference.    Significant Diagnostic Studies: CT ANGIO HEAD W OR WO CONTRAST  Result Date: 12/20/2020 CLINICAL DATA:  Stroke. Right eyelid droop and blurred vision  for months. History of bladder cancer. EXAM: CT ANGIOGRAPHY HEAD AND NECK TECHNIQUE: Multidetector CT imaging of the head and neck was performed using the standard protocol during bolus administration of intravenous contrast. Multiplanar CT image reconstructions and MIPs were obtained to evaluate the vascular anatomy. Carotid stenosis measurements (when applicable) are obtained utilizing NASCET criteria, using the distal internal carotid diameter as the denominator. CONTRAST:  58mL OMNIPAQUE IOHEXOL 350 MG/ML SOLN COMPARISON:  CT head 12/17/2020.  MRI 12/19/2020 FINDINGS: CT HEAD FINDINGS Brain: Multiple small areas of acute infarct bilaterally are best seen on diffusion-weighted imaging and difficult to see by CT. Generalized atrophy.  No acute hemorrhage or mass. Vascular: Negative for hyperdense vessel Skull: Negative Sinuses: Negative Orbits: Negative Review of the MIP images confirms the above findings CTA NECK FINDINGS Aortic arch: Standard branching. Imaged portion shows no evidence of aneurysm or dissection. No significant stenosis of the major arch vessel origins. Atherosclerotic calcification in the aortic arch. Right carotid system: Mild atherosclerotic calcification right carotid bulb. Just above this area is a thin soft tissue web causing severe stenosis of the internal carotid artery. On sagittal image 65, there appears to be a very small patent lumen through the web. Estimated 90% diameter stenosis. Left carotid system: Atherosclerotic calcification left carotid bulb without  significant stenosis. Vertebral arteries: Both vertebral arteries are widely patent to the skull base without stenosis. Skeleton: No acute skeletal abnormality. Other neck: Negative for mass or adenopathy. No thyroid tissue. Correlate with surgical history. Upper chest: Apical emphysema. 11 mm spiculated nodule right apex extending to the pleural surface. No change from prior chest CT 09/26/2020 Review of the MIP images confirms the above findings CTA HEAD FINDINGS Anterior circulation: Atherosclerotic calcification in the cavernous carotid bilaterally causing mild stenosis bilaterally. Anterior and middle cerebral arteries patent bilaterally without stenosis. Posterior circulation: Both vertebral arteries patent to the basilar. PICA patent bilaterally. Basilar widely patent. Superior cerebellar and posterior cerebral arteries patent bilaterally. Fetal origin posterior cerebral artery bilaterally. Venous sinuses: Normal venous enhancement. Anatomic variants: None Review of the MIP images confirms the above findings IMPRESSION: 1. Multiple small areas of acute infarct identified on diffusion-weighted imaging yesterday. These are not identified by CT. No acute hemorrhage. 2. High-grade stenosis proximal right internal carotid artery due to a short segment web. Just below the web there is atherosclerotic calcification not causing significant stenosis 3. No significant left carotid stenosis. Both vertebral arteries widely patent 4. Negative for intracranial stenosis or large vessel occlusion. Electronically Signed   By: Franchot Gallo M.D.   On: 12/20/2020 13:20   CT HEAD WO CONTRAST (5MM)  Result Date: 12/17/2020 CLINICAL DATA:  Reduced vision in right eye EXAM: CT HEAD WITHOUT CONTRAST TECHNIQUE: Contiguous axial images were obtained from the base of the skull through the vertex without intravenous contrast. COMPARISON:  09/24/2020 FINDINGS: Brain: No evidence of acute infarction, hemorrhage, hydrocephalus,  extra-axial collection or mass lesion/mass effect. Subcortical white matter and periventricular small vessel ischemic changes. Vascular: Intracranial atherosclerosis. Skull: Normal. Negative for fracture or focal lesion. Sinuses/Orbits: The visualized paranasal sinuses are essentially clear. The mastoid air cells are unopacified. Mild proptosis of the bilateral orbits, left greater than right, unchanged. Retroconal soft tissues are within normal limits. Other: None. IMPRESSION: No evidence of acute intracranial abnormality. Small vessel ischemic changes. Mild proptosis of the bilateral orbits, left greater than right, unchanged. Retroconal soft tissues are within normal limits. Electronically Signed   By: Julian Hy M.D.   On: 12/17/2020 00:35  CT ANGIO NECK W OR WO CONTRAST  Result Date: 12/20/2020 CLINICAL DATA:  Stroke. Right eyelid droop and blurred vision for months. History of bladder cancer. EXAM: CT ANGIOGRAPHY HEAD AND NECK TECHNIQUE: Multidetector CT imaging of the head and neck was performed using the standard protocol during bolus administration of intravenous contrast. Multiplanar CT image reconstructions and MIPs were obtained to evaluate the vascular anatomy. Carotid stenosis measurements (when applicable) are obtained utilizing NASCET criteria, using the distal internal carotid diameter as the denominator. CONTRAST:  53mL OMNIPAQUE IOHEXOL 350 MG/ML SOLN COMPARISON:  CT head 12/17/2020.  MRI 12/19/2020 FINDINGS: CT HEAD FINDINGS Brain: Multiple small areas of acute infarct bilaterally are best seen on diffusion-weighted imaging and difficult to see by CT. Generalized atrophy.  No acute hemorrhage or mass. Vascular: Negative for hyperdense vessel Skull: Negative Sinuses: Negative Orbits: Negative Review of the MIP images confirms the above findings CTA NECK FINDINGS Aortic arch: Standard branching. Imaged portion shows no evidence of aneurysm or dissection. No significant stenosis of the  major arch vessel origins. Atherosclerotic calcification in the aortic arch. Right carotid system: Mild atherosclerotic calcification right carotid bulb. Just above this area is a thin soft tissue web causing severe stenosis of the internal carotid artery. On sagittal image 65, there appears to be a very small patent lumen through the web. Estimated 90% diameter stenosis. Left carotid system: Atherosclerotic calcification left carotid bulb without significant stenosis. Vertebral arteries: Both vertebral arteries are widely patent to the skull base without stenosis. Skeleton: No acute skeletal abnormality. Other neck: Negative for mass or adenopathy. No thyroid tissue. Correlate with surgical history. Upper chest: Apical emphysema. 11 mm spiculated nodule right apex extending to the pleural surface. No change from prior chest CT 09/26/2020 Review of the MIP images confirms the above findings CTA HEAD FINDINGS Anterior circulation: Atherosclerotic calcification in the cavernous carotid bilaterally causing mild stenosis bilaterally. Anterior and middle cerebral arteries patent bilaterally without stenosis. Posterior circulation: Both vertebral arteries patent to the basilar. PICA patent bilaterally. Basilar widely patent. Superior cerebellar and posterior cerebral arteries patent bilaterally. Fetal origin posterior cerebral artery bilaterally. Venous sinuses: Normal venous enhancement. Anatomic variants: None Review of the MIP images confirms the above findings IMPRESSION: 1. Multiple small areas of acute infarct identified on diffusion-weighted imaging yesterday. These are not identified by CT. No acute hemorrhage. 2. High-grade stenosis proximal right internal carotid artery due to a short segment web. Just below the web there is atherosclerotic calcification not causing significant stenosis 3. No significant left carotid stenosis. Both vertebral arteries widely patent 4. Negative for intracranial stenosis or large  vessel occlusion. Electronically Signed   By: Franchot Gallo M.D.   On: 12/20/2020 13:20   MR BRAIN W WO CONTRAST  Result Date: 12/19/2020 CLINICAL DATA:  Acute neuro deficit.  Suspect brain metastasis. EXAM: MRI HEAD WITHOUT AND WITH CONTRAST TECHNIQUE: Multiplanar, multiecho pulse sequences of the brain and surrounding structures were obtained without and with intravenous contrast. CONTRAST:  6mL GADAVIST GADOBUTROL 1 MMOL/ML IV SOLN COMPARISON:  CT head 12/17/2020 FINDINGS: Brain: Multiple small areas of restricted diffusion are present. These are present in both cerebral hemispheres, right greater than left. Small areas of restricted diffusion also in the cerebellum bilaterally. These areas do not show abnormal enhancement. No hemorrhage. Ventricle size normal.  No midline shift. Vascular: Normal arterial flow voids at the skull base. Skull and upper cervical spine: Negative Sinuses/Orbits: Paranasal sinuses clear. Left mastoid effusion. Bilateral cataract extraction Other: None IMPRESSION: Multiple small areas  of infarct in both cerebral hemispheres and cerebellum bilaterally. Findings suggests multiple emboli Negative for metastatic disease. Electronically Signed   By: Franchot Gallo M.D.   On: 12/19/2020 16:08   CT CHEST ABDOMEN PELVIS W CONTRAST  Result Date: 12/16/2020 CLINICAL DATA:  Unintended weight loss with loss of appetite increased weakness over the past several weeks. EXAM: CT CHEST, ABDOMEN, AND PELVIS WITH CONTRAST TECHNIQUE: Multidetector CT imaging of the chest, abdomen and pelvis was performed following the standard protocol during bolus administration of intravenous contrast. CONTRAST:  79mL OMNIPAQUE IOHEXOL 300 MG/ML  SOLN COMPARISON:  CT chest, abdomen, and pelvis dated September 26, 2020. FINDINGS: CT CHEST FINDINGS Cardiovascular: No significant vascular findings. Normal heart size. No pericardial effusion. No thoracic aortic aneurysm or dissection. Coronary, aortic arch, and  branch vessel atherosclerotic vascular disease. No central pulmonary embolism. Mediastinum/Nodes: No enlarged mediastinal, hilar, or axillary lymph nodes. Diminutive or absent thyroid gland, unchanged. Trachea and esophagus demonstrate no significant findings. Lungs/Pleura: Unchanged 1.3 x 0.7 cm spiculated nodular density in the right upper lobe (series 3, image 25). Resolved interstitial infiltrates in the upper lobes and right middle lobe. Unchanged mild paraseptal emphysema in scattered minimally increased subpleural reticulation. No focal consolidation, pleural effusion, or pneumothorax. Musculoskeletal: No chest wall mass or suspicious bone lesions identified. New acute to subacute mild T12 superior endplate compression fracture with minimal height loss. CT ABDOMEN PELVIS FINDINGS Hepatobiliary: No focal liver abnormality is seen. No gallstones, gallbladder wall thickening, or biliary dilatation. Pancreas: Unremarkable. No pancreatic ductal dilatation or surrounding inflammatory changes. Spleen: Normal in size without focal abnormality. Adrenals/Urinary Tract: The adrenal glands and both kidneys are unremarkable. No renal calculi or hydronephrosis. Homogeneously enhancing 2.1 x 1.4 cm right lateral bladder wall mass (series 2, image 105). Stomach/Bowel: Unchanged small hiatal hernia. Previously described possible mass of the gastric cardia represents normal folded gastric mucosa. The small bowel is unremarkable. Extensive sigmoid colonic diverticulosis again noted with chronic long segment wall thickening of the proximal and mid sigmoid colon and mild surrounding fat stranding. 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 History of prior appendectomy. Vascular/Lymphatic: Slightly increased bilobed fusiform aneurysmal dilatation of the infrarenal abdominal aorta measuring up to 3.9 cm (series 4, image 54), previously 3.8 cm (by my measurement). Visceral arteries are patent. Aortoiliac  atherosclerotic vascular disease. No enlarged abdominal or pelvic lymph nodes. Reproductive: Uterus and bilateral adnexa are unremarkable. Other: Unchanged tiny fat containing umbilical hernia. No free fluid or pneumoperitoneum. Musculoskeletal: No acute or significant osseous findings. IMPRESSION: CT chest: 1. Unchanged 1.3 cm spiculated nodular density in the right upper lobe, favor apical scarring. Continued attention on follow-up is recommended. 2. New benign-appearing acute to subacute T12 superior endplate compression fracture. 3. Resolved multifocal pneumonia. 4. Aortic Atherosclerosis (ICD10-I70.0) and Emphysema (ICD10-J43.9). CT abdomen pelvis: 1. 2.1 cm right lateral bladder wall mass, concerning for primary bladder carcinoma. Urine cytology and cystoscopy are recommended for further evaluation. 2. Slightly increased bilobed fusiform aneurysmal dilatation of the infrarenal abdominal aorta measuring up to 3.9 cm, previously 3.8 cm. Recommend follow-up every 2 years. This recommendation follows ACR consensus guidelines: White Paper of the ACR Incidental Findings Committee II on Vascular Findings. J Am Coll Radiol 2013; 10:789-794. 3. 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 Electronically Signed   By: Titus Dubin M.D.   On: 12/16/2020 13:36   US Carotid Bilateral  Result Date: 12/20/2020 CLINICAL DATA:  70 year old female with a history stroke EXAM: BILATERAL CAROTID DUPLEX ULTRASOUND TECHNIQUE: Pearline Cables  scale imaging, color Doppler and duplex ultrasound were performed of bilateral carotid and vertebral arteries in the neck. COMPARISON:  No prior duplex FINDINGS: Criteria: Quantification of carotid stenosis is based on velocity parameters that correlate the residual internal carotid diameter with NASCET-based stenosis levels, using the diameter of the distal internal carotid lumen as the denominator for stenosis measurement. The following velocity measurements  were obtained: RIGHT ICA:  Systolic 623 cm/sec, Diastolic 79 cm/sec CCA:  57 cm/sec SYSTOLIC ICA/CCA RATIO:  4.5 ECA:  121 cm/sec LEFT ICA:  Systolic 82 cm/sec, Diastolic 25 cm/sec CCA:  762 cm/sec SYSTOLIC ICA/CCA RATIO:  0.7 ECA:  71 cm/sec Right Brachial SBP: Not acquired Left Brachial SBP: Not acquired RIGHT CAROTID ARTERY: No significant calcifications of the right common carotid artery. Intermediate waveform maintained. Heterogeneous and partially calcified plaque at the right carotid bifurcation. No significant lumen shadowing. Low resistance waveform of the right ICA. No significant tortuosity. RIGHT VERTEBRAL ARTERY: Antegrade flow with low resistance waveform. LEFT CAROTID ARTERY: No significant calcifications of the left common carotid artery. Intermediate waveform maintained. Heterogeneous and partially calcified plaque at the left carotid bifurcation without significant lumen shadowing. Low resistance waveform of the left ICA. No significant tortuosity. LEFT VERTEBRAL ARTERY:  Antegrade flow with low resistance waveform. IMPRESSION: Right: Heterogeneous and partially calcified plaque at the right carotid bifurcation contributes to 70%-99% stenosis by established duplex criteria. Left: Color duplex indicates minimal heterogeneous and calcified plaque, with no hemodynamically significant stenosis by duplex criteria in the extracranial cerebrovascular circulation. Signed, Dulcy Fanny. Dellia Nims, RPVI [facility] Electronically Signed   By: Corrie Mckusick D.O.   On: 12/20/2020 11:35    Microbiology: Recent Results (from the past 240 hour(s))  Resp Panel by RT-PCR (Flu A&B, Covid) Nasopharyngeal Swab     Status: None   Collection Time: 12/16/20 11:50 AM   Specimen: Nasopharyngeal Swab; Nasopharyngeal(NP) swabs in vial transport medium  Result Value Ref Range Status   SARS Coronavirus 2 by RT PCR NEGATIVE NEGATIVE Final    Comment:  (NOTE) SARS-CoV-2 target nucleic acids are NOT DETECTED.  The SARS-CoV-2 RNA is generally detectable in upper respiratory specimens during the acute phase of infection. The lowest concentration of SARS-CoV-2 viral copies this assay can detect is 138 copies/mL. A negative result does not preclude SARS-Cov-2 infection and should not be used as the sole basis for treatment or other patient management decisions. A negative result may occur with  improper specimen collection/handling, submission of specimen other than nasopharyngeal swab, presence of viral mutation(s) within the areas targeted by this assay, and inadequate number of viral copies(<138 copies/mL). A negative result must be combined with clinical observations, patient history, and epidemiological information. The expected result is Negative.  Fact Sheet for Patients:  EntrepreneurPulse.com.au  Fact Sheet for Healthcare Providers:  IncredibleEmployment.be  This test is no t yet approved or cleared by the [place] FDA and  has been authorized for detection and/or diagnosis of SARS-CoV-2 by FDA under an Emergency Use Authorization (EUA). This EUA will remain  in effect (meaning this test can be used) for the duration of the COVID-19 declaration under Section 564(b)(1) of the Act, 21 U.S.C.section 360bbb-3(b)(1), unless the authorization is terminated  or revoked sooner.       Influenza A by PCR NEGATIVE NEGATIVE Final   Influenza B by PCR NEGATIVE NEGATIVE Final    Comment: (NOTE) The Xpert Xpress SARS-CoV-2/FLU/RSV plus assay is intended as an aid in the diagnosis of  influenza from Nasopharyngeal swab specimens and should not be used as a sole basis for treatment. Nasal washings and aspirates are unacceptable for Xpert Xpress SARS-CoV-2/FLU/RSV testing.  Fact Sheet for Patients: EntrepreneurPulse.com.au  Fact Sheet for Healthcare  Providers: IncredibleEmployment.be  This test is not yet approved or cleared by the [place] FDA and has been authorized for detection and/or diagnosis of SARS-CoV-2 by FDA under an Emergency Use Authorization (EUA). This EUA will remain in effect (meaning this test can be used) for the duration of the COVID-19 declaration under Section 564(b)(1) of the Act, 21 U.S.C. section 360bbb-3(b)(1), unless the authorization is terminated or revoked.  Performed at [facility], [address], [place], [place] [address]      Labs: Basic Metabolic Panel: Recent Labs  Lab 12/16/20 1110 12/17/20 0513 12/18/20 0456 12/19/20 0723 12/20/20 0617  NA 129* 133* 133* 134* 134*  K 2.5* 4.1 3.4* 2.7* 2.7*  CL 85* 95* 94* 94* 96*  CO2 27 25 25 28 26   GLUCOSE 94 61* 55* 61* 65*  BUN 10 8 6* <5* <5*  CREATININE 0.60 0.47 0.45 0.46 0.49  CALCIUM 8.0* 7.3* 7.1* 7.4* 7.5*  MG 1.6* 1.9  --  1.4* 1.7   Liver Function Tests: Recent Labs  Lab 12/16/20 1110 12/17/20 0513 12/19/20 0723  AST 59* 45* 31  ALT 18 15 15   ALKPHOS 84 63 66  BILITOT 1.6* 1.3* 1.3*  PROT 6.7 5.4* 5.6*  ALBUMIN 2.4* 2.0* 2.2*   No results for input(s): LIPASE, AMYLASE in the last 168 hours. No results for input(s): AMMONIA in the last 168 hours. CBC: Recent Labs  Lab 12/16/20 1110 12/17/20 0513 12/17/20 1655 12/18/20 0456 12/19/20 0723 12/20/20 0617  WBC 5.2 2.7*  --  4.0 3.4* 3.6*  NEUTROABS 3.9  --   --   --   --   --   HGB 8.2* 6.1* 9.9* 10.2* 9.4* 9.0*  HCT 26.8* 20.1* 31.5* 32.0* 30.5* 29.9*  MCV 89.9 91.0  --  87.7 88.2 87.9  PLT 120* 79*  --  90* 75* 72*   Cardiac Enzymes: No results for input(s): CKTOTAL, CKMB, CKMBINDEX, TROPONINI in the last 168 hours. BNP: Invalid input(s): POCBNP CBG: Recent Labs  Lab 12/18/20 1048 12/18/20 1157  GLUCAP 68* 145*    Time coordinating discharge:  36 minutes  Signed:  Orson Eva, DO [facility] Pager: [phone] 12/20/2020,  3:24 PM

## 2020-12-20 NOTE — Care Management Important Message (Signed)
Important Message  Patient Details  Name: Olivia Blanchard MRN: [id number] Date of Birth: 1950/03/05   Medicare Important Message Given:  Yes     Tommy Medal 12/20/2020, 11:59 AM

## 2020-12-20 NOTE — TOC Progression Note (Signed)
Transition of Care [facility]) - Progression Note    Patient Details  Name: Olivia Blanchard MRN: [id number] Date of Birth: [birth year]  [facility]) CM/SW Contact  Shade Flood, LCSW Phone Number: 12/20/2020, 12:24 PM  Clinical Narrative:     TOC following. Pt has not been active with [facility] in the past and they cannot accept her. Referred to [facility] and they can accept. Added to pt's AVS. MD anticipating dc tomorrow.  Weekend TOC will follow.  Expected Discharge Plan: [place] Barriers to Discharge: Continued Medical Work up  Expected Discharge Plan and Services Expected Discharge Plan: [place] In-house Referral: Clinical Social Work   Post Acute Care Choice: [place] arrangements for the past 2 months: Single Family Home                 DME Arranged: N/A DME Agency: NA       HH Arranged: PT [place] Agency: [place] ([place]) Date [place]: 12/20/20   Representative spoke with at [place]: [place] ([place]) Interventions    Readmission Risk Interventions No flowsheet data found.

## 2020-12-20 NOTE — Telephone Encounter (Signed)
Crawford Givens with [facility] is calling and they received order from [employer] for this patient for hospice care and samantha would like to know if dr Ethlyn Gallery would be the attending physician

## 2020-12-20 NOTE — TOC Transition Note (Signed)
Transition of Care [facility]) - CM/SW Discharge Note   Patient Details  Name: Olivia Blanchard MRN: [id number] Date of Birth: 21-Mar-1950  Transition of Care [facility]) CM/SW Contact:  Shade Flood, LCSW Phone Number: 12/20/2020, 1:39 PM   Clinical Narrative:     After speaking with Dr. Carles Collet, pt and her husband have elected to return home with hospice care. TOC spoke with pt's husband to discuss CMS provider options. Referred to [facility] at husband's request. Pt's husband stated that they do not need any DME at this time and that he can transport pt home. Hospice will follow up with pt at home.  There are no other TOC needs for dc.  Final next level of care: Home w Hospice Care Barriers to Discharge: Barriers Resolved   Patient Goals and CMS Choice Patient states their goals for this hospitalization and ongoing recovery are:: return home CMS Medicare.gov Compare Post Acute Care list provided to:: Patient Choice offered to / list presented to : Patient, Spouse  Discharge Placement                       Discharge Plan and Services In-house Referral: Clinical Social Work   Post Acute Care Choice: Home Health          DME Arranged: N/A DME Agency: NA       HH Arranged: PT [place] Agency: [place] ([place]) Date [place]: 12/20/20   Representative spoke with at [place]: [place] ([place]) Interventions     Readmission Risk Interventions No flowsheet data found.

## 2020-12-23 NOTE — Telephone Encounter (Signed)
OV sch'd 01/30/21

## 2020-12-23 NOTE — Telephone Encounter (Signed)
Left a detailed message on Olivia Blanchard's voicemail with the approval as below.

## 2020-12-24 ENCOUNTER — Telehealth: Payer: Self-pay | Admitting: Urology

## 2020-12-24 NOTE — Telephone Encounter (Signed)
LMOM for pt. To call me back to discuss Surgery date of 01/02/21.

## 2021-01-02 ENCOUNTER — Telehealth: Payer: Self-pay | Admitting: Family Medicine

## 2021-01-02 NOTE — Progress Notes (Signed)
[facility] Posting Form   Surgery Date/Time: Date: 01/16/2021  Surgeon: Dr. Nicolette Bang, MD  Surgery Location: Day Surgery  Inpt ( No  )   Outpt (Yes)   Obs ( No  )   Diagnosis: D49.4 Bladder Tumor  -CPT: 12878,[id number], with possible 52332  Surgery: Transurethral resection of bladder tumor with instillation of Gemcitabine, Cystoscopy with bilateral retrograde pyelograms and possible ureteral stent placement.  Stop Anticoagulations: Yes, may continue ASA  Cardiac/Medical/Pulmonary Clearance needed: no   *Orders entered into EPIC  Date: 01/02/21   *Case booked in EPIC  Date: 01/02/21  *Notified pt of Surgery: Date: 01/02/21  *Placed into Prior Authorization Work Fabio Bering Date: 01/02/21   Assistant/laser/rep:No

## 2021-01-02 NOTE — Telephone Encounter (Signed)
Olivia Blanchard, a Doctor with [facility] called because patient was sent there from [facility] for terminal care. Her diagnosis was Malnutrition. Dr.Karb is sending her home and patient will need to have PT in her home as well. He is asking to speak with Dr.Koberlein about reassuming care for patient to update her on patient's health.   Dr.Karb's number is [phone]  He is available to speak today and tomorrow  Please advise

## 2021-01-02 NOTE — Addendum Note (Signed)
Addended by: Gerald Leitz A on: 01/02/2021 03:09 PM   Modules accepted: Orders

## 2021-01-03 ENCOUNTER — Other Ambulatory Visit: Payer: Self-pay | Admitting: Family Medicine

## 2021-01-03 ENCOUNTER — Telehealth: Payer: Self-pay | Admitting: Family Medicine

## 2021-01-03 DIAGNOSIS — C679 Malignant neoplasm of bladder, unspecified: Secondary | ICD-10-CM

## 2021-01-03 DIAGNOSIS — R5381 Other malaise: Secondary | ICD-10-CM

## 2021-01-03 DIAGNOSIS — Z7401 Bed confinement status: Secondary | ICD-10-CM | POA: Diagnosis not present

## 2021-01-03 DIAGNOSIS — R0902 Hypoxemia: Secondary | ICD-10-CM | POA: Diagnosis not present

## 2021-01-03 DIAGNOSIS — E46 Unspecified protein-calorie malnutrition: Secondary | ICD-10-CM

## 2021-01-03 DIAGNOSIS — R279 Unspecified lack of coordination: Secondary | ICD-10-CM | POA: Diagnosis not present

## 2021-01-03 MED ORDER — LORAZEPAM 0.5 MG PO TABS: 0.2500 mg | ORAL_TABLET | Freq: Two times a day (BID) | ORAL | 0 refills | Status: DC | PRN

## 2021-01-03 NOTE — Telephone Encounter (Signed)
Spoke with the patient's husband, he stated the patient is asleep and will call back for an appt.

## 2021-01-03 NOTE — Telephone Encounter (Signed)
I spoke with Dr. Sonny Dandy. Please see if we can get patient in next week for follow up to touch base.

## 2021-01-03 NOTE — Telephone Encounter (Signed)
Jenny Reichmann rn with [facility] is calling and would like to speak with nurse or  md  concerning needing new rx for medications if md would like pt to continue. Please call cindy at [phone] ext [phone]

## 2021-01-03 NOTE — Telephone Encounter (Signed)
Phone call from palliative care,Dr. Sonny Dandy regarding Olivia Blanchard.  Patient was admitted to [facility] hospital.she wasn't eating, had significant weight loss. Initial CT scan showed thickened gastric wall concerning for stomach mass.  Patient was told that she had a cancer.  Dr. Estanislado Spire stated that a repeat CAT scan showed that this questionable stomach.  She does have a 3 cm bladder mass.  She became very Deconditioned during hospital stay and was discharged home with palliative care.  She was evaluated in the house, and her husband felt that he was not able to care for her in the home.  She was admitted to [facility], for final comfort care stages, but while there started to improve, eat, more activity.  They did not feel that she was appropriate for that level of care any longer.  He does not feel that she is necessarily terminal.  She has no evidence of metastatic cancer.  Patient was discharged home yesterday, but wants to continue with physical therapy at home.  He suggested follow-up next week to discuss appetite, poor eating, mood and help with any factors that may help with her recovery.  He suggested follow-up with urology.  It appears the patient already has procedure for bladder tumor removal scheduled in about 2 weeks.  He will make sure she gets in for follow-up appointment, and we can talk through additional concerns at next visit.  I will order PT for home today.

## 2021-01-03 NOTE — Telephone Encounter (Signed)
Spoke with Olivia Blanchard and she stated the patient had anxiety and insomnia.  Olivia Blanchard questioned if the patient should continue Lorazepam 0.5mg  BID as this was given on a regular schedule and Temazepam 15-30mg  was given at bedtime as needed for insomnia-and if so, the patient would need refills sent to [facility].  Message sent to PCP.

## 2021-01-06 ENCOUNTER — Other Ambulatory Visit (HOSPITAL_COMMUNITY): Payer: Self-pay | Admitting: Hematology

## 2021-01-07 NOTE — Telephone Encounter (Signed)
Left a message for the patient to return my call.  

## 2021-01-08 ENCOUNTER — Telehealth: Payer: Self-pay | Admitting: Family Medicine

## 2021-01-08 NOTE — Telephone Encounter (Signed)
Error/njr °

## 2021-01-09 ENCOUNTER — Telehealth: Payer: Self-pay

## 2021-01-09 DIAGNOSIS — E440 Moderate protein-calorie malnutrition: Secondary | ICD-10-CM | POA: Diagnosis not present

## 2021-01-09 DIAGNOSIS — Z515 Encounter for palliative care: Secondary | ICD-10-CM | POA: Diagnosis not present

## 2021-01-09 DIAGNOSIS — D494 Neoplasm of unspecified behavior of bladder: Secondary | ICD-10-CM | POA: Diagnosis not present

## 2021-01-09 NOTE — Telephone Encounter (Signed)
Received call from Dominica Severin, NP.  Patient was sent home from hospital with hospice. Per Suanne Marker, patient started to eat and drink again after getting to [facility] and wanted to proceed with surgery after getting to hospice facility.  Suanne Marker was calling to inform office that patient BP is running low in low 90's and does not feel like patient can get to the hospital at this time. Suanne Marker has reached out to patient PCP for evaluation.  Informed Rhonda, patient will need to see MD back in our office to proceed with surgery it starting to have symptoms and was dc'd from hospital to hospice per Dr. Alyson Ingles.  Suanne Marker stated patient is not having any urinary symptoms or hematuria at this time.

## 2021-01-09 NOTE — Telephone Encounter (Signed)
NP at home today to set pt up for palliative care (pt d/c from hospice last week). During assessment, supine BP 90/50. When attempted to get the patient up to restroom, but complained of vertigo & lightheadedness. Pt currently taking Metoprolol 50mg  & continues to be tachycardic.Pt was advised by NP to hold until further instructions rec'd from PCP. NP has not completed orthostatics at this time.  NP is also requesting HH for PT as pt is homebound & she believes that pt will benefit from this.  Of note: pt has bladder tumor resection scheduled for 01/16/21.

## 2021-01-10 ENCOUNTER — Other Ambulatory Visit: Payer: Self-pay | Admitting: Family Medicine

## 2021-01-10 MED ORDER — ALPRAZOLAM 0.25 MG PO TABS: 0.2500 mg | ORAL_TABLET | Freq: Every evening | ORAL | 0 refills | Status: AC | PRN

## 2021-01-10 NOTE — Telephone Encounter (Signed)
I agree that patient needs in person evaluation. We had reached out to schedule this last week, (looks like you left message) but it doesn't look like this was scheduled. Please see if patient can be scheduled. It would help to get active med list because we don't even have this so I don't know all meds since her recent hospitalization. Agree with holding metoprolol if remaining hypotensive. Please see if we can get patient in for visit.

## 2021-01-10 NOTE — Telephone Encounter (Signed)
See prior phone note. 

## 2021-01-10 NOTE — Telephone Encounter (Signed)
Spoke with Suanne Marker and informed her of the requests as below.  Suanne Marker stated the heart rate was 112 yesterday, unknown for today as she is not at the patients home, BP was 90/50 lying and 76/58 standing on 1/5.  Suanne Marker stated she is a NP and cannot take orders, the patient is bedbound and she feels the patient needs an appt.  Suanne Marker stated she will revisit the patient next Thursday.  Message sent to PCP.

## 2021-01-10 NOTE — Telephone Encounter (Signed)
Per PCP I spoke with the patient and informed her of the message below.  A message will be sent to a manager to add the time slot on 1/13 at 10:30 and the patient is aware to arrive at 10:15am.  Patient stated she was given Lorazepam, does not like the way this made her feel and this did not help her sleep at night.  Message sent to PCP to contact the patient for further information.

## 2021-01-10 NOTE — Telephone Encounter (Signed)
*  how tachycardic is she? How are blood pressures doing now? And did they get orthostatics since message yesterday?  *ok for home health for PT

## 2021-01-12 NOTE — Telephone Encounter (Signed)
°  Olivia Blanchard - can you check in with patient on Monday and see if she was able to get BP cuff over weekend? How numbers are looking? (Rest of note below is just for me)      I spoke with patient on Friday evening.   She states that she is still feeling weak. She needs assistance to do anything. She does not like the lorazepam. Previously she had low dose xanax (0.25mg ) and this helped her with sleep without the sedating effects she feels with the lorazepam. She would prefer to return to xanax prn bedtime. She has not had good nights sleep for a few nights.   Appetite is poor, but she is trying to work on regular eating.  She doesn't have blood pressure cuff athome. Encouraged her to get one so that she could monitor pressures for [place]. She has stopped the metoprolol at this point.only medication that she is taking regularly right now is her synthroid 112mcg daily.   I did send in xanax. Stopped the lorazepam. She has visit on Friday. She will work on getting cuff/checking pressures overweekend. We discussed importance of eating to regain strength; esp getting in some protein. Discussed adding in boost/ensure/similar to get in some extra calories when maybe not feeling able to eat.

## 2021-01-13 NOTE — Telephone Encounter (Signed)
Left a message for the patient to return my call.  

## 2021-01-14 ENCOUNTER — Encounter (HOSPITAL_COMMUNITY): Payer: PPO

## 2021-01-14 NOTE — Telephone Encounter (Signed)
Spoke with the patient and she stated she has a cuff at home but could not figure out how to use this.  Stated the nurse will be coming to her home on Thursday.  Message sent to PCP.

## 2021-01-15 NOTE — Telephone Encounter (Signed)
Noted ok. 

## 2021-01-16 ENCOUNTER — Telehealth: Payer: Self-pay | Admitting: Family Medicine

## 2021-01-16 ENCOUNTER — Ambulatory Visit (HOSPITAL_COMMUNITY): Admission: RE | Admit: 2021-01-16 | Payer: PPO | Source: Home / Self Care | Admitting: Urology

## 2021-01-16 ENCOUNTER — Encounter (HOSPITAL_COMMUNITY): Admission: RE | Payer: Self-pay | Source: Home / Self Care

## 2021-01-16 DIAGNOSIS — Z515 Encounter for palliative care: Secondary | ICD-10-CM | POA: Diagnosis not present

## 2021-01-16 DIAGNOSIS — D494 Neoplasm of unspecified behavior of bladder: Secondary | ICD-10-CM | POA: Diagnosis not present

## 2021-01-16 DIAGNOSIS — E43 Unspecified severe protein-calorie malnutrition: Secondary | ICD-10-CM | POA: Diagnosis not present

## 2021-01-16 SURGERY — TURBT (TRANSURETHRAL RESECTION OF BLADDER TUMOR)
Anesthesia: General

## 2021-01-16 NOTE — Telephone Encounter (Signed)
Dominica Severin NP with Palliative Care stated that the patient is not eating and cannot stand up very well. The patient has experienced weight loss and currently weighs 111 pounds. Dominica Severin is referring patient back to hospice due to the patient not wanting anything else to be done (labs or any other appointments). Patient does have a DNR order in place. Suanne Marker also stated that the patient only wants hospice to take care of her and be her PCP. The patient is still very alert and has stable vital signs today.  Please advise.

## 2021-01-17 ENCOUNTER — Telehealth: Payer: PPO | Admitting: Family Medicine

## 2021-01-17 NOTE — Telephone Encounter (Signed)
Spoke with Colletta Maryland at [facility] at [phone] and left a detailed message with the information below to be relayed to Nelson per PCP.

## 2021-01-17 NOTE — Telephone Encounter (Signed)
OK to re-visit hospice consult, but:    She was supposed to follow up in office today; then it was changed to virtual. If I cannot see her then it is hard to make care decisions. Hospice had discharged her because she was improving and they did not feel she was appropriate for hospice. I feel that she may need other evaluation or at least follow up with an in person exam. They can see if hospice can reassess and i'm not opposed to hospice care, but her former hospice provider called me specifically to say she wasn't meeting their requirements. Since they are more easily able to assess in home, it would be ok to have them consulted back on board so that they can monitor and further evaluate. She may need other providers to do in home evaluation (ie former hospice provider felt mood may need to be explored) but hopefully between palliative care/hospice back on board this can be arranged for her more efficiently.

## 2021-01-22 ENCOUNTER — Ambulatory Visit: Payer: PPO | Admitting: Urology

## 2021-01-30 ENCOUNTER — Ambulatory Visit (INDEPENDENT_AMBULATORY_CARE_PROVIDER_SITE_OTHER): Payer: PPO | Admitting: Gastroenterology

## 2021-02-05 DEATH — deceased

## 2021-02-28 ENCOUNTER — Other Ambulatory Visit: Payer: Self-pay | Admitting: Endocrinology

## 2021-03-31 ENCOUNTER — Telehealth: Payer: Self-pay | Admitting: Family Medicine

## 2021-03-31 NOTE — Telephone Encounter (Signed)
Tried calling patient to r/s her awv on 04/29/21 appt. ? ? ?No answer ? ?

## 2021-04-21 ENCOUNTER — Telehealth: Payer: Self-pay | Admitting: Family Medicine

## 2021-04-21 NOTE — Telephone Encounter (Signed)
I spoke with spouse to reschedule awv appt. ?Sonia Side stated patient passed away. ? ? ?

## 2021-04-29 ENCOUNTER — Ambulatory Visit: Payer: PPO

## 2021-12-20 IMAGING — CT CT ANGIO HEAD
1 of 11 series · 5 of 33 positions shown · IV contrast (Omnipaque or Isovue)
Comparison: CT head 12/17/2020.  MRI 12/19/2020

CLINICAL DATA: Stroke. Right eyelid droop and blurred vision for
months. History of bladder cancer.

EXAM:
CT ANGIOGRAPHY HEAD AND NECK
TECHNIQUE: Multidetector CT imaging of the head and neck was performed using
the standard protocol during bolus administration of intravenous
contrast. Multiplanar CT image reconstructions and MIPs were
obtained to evaluate the vascular anatomy. Carotid stenosis
measurements (when applicable) are obtained utilizing NASCET
criteria, using the distal internal carotid diameter as the
denominator.
CONTRAST:  75mL OMNIPAQUE IOHEXOL 350 MG/ML SOLN

[Series 10: ax thins · axial · 0.39mm/px · z∈[-241,-29]mm · 5 of 319 slices shown]
[im 54/319  soft-tissue]
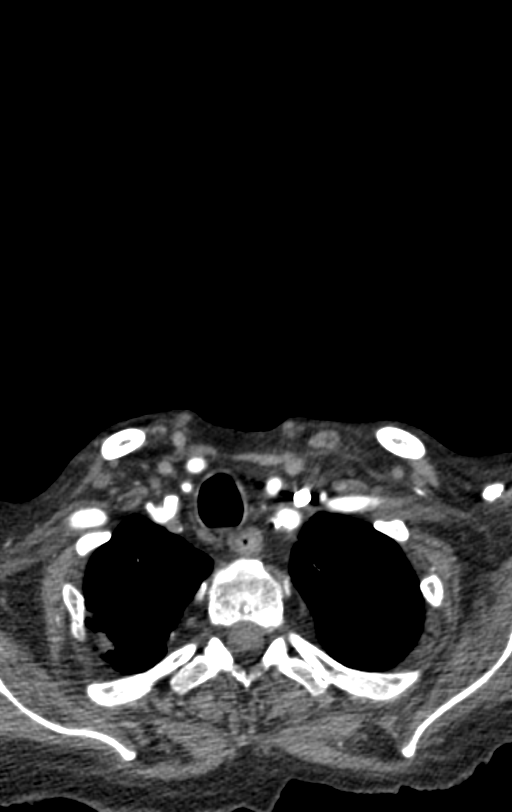
[im 107/319  bone]
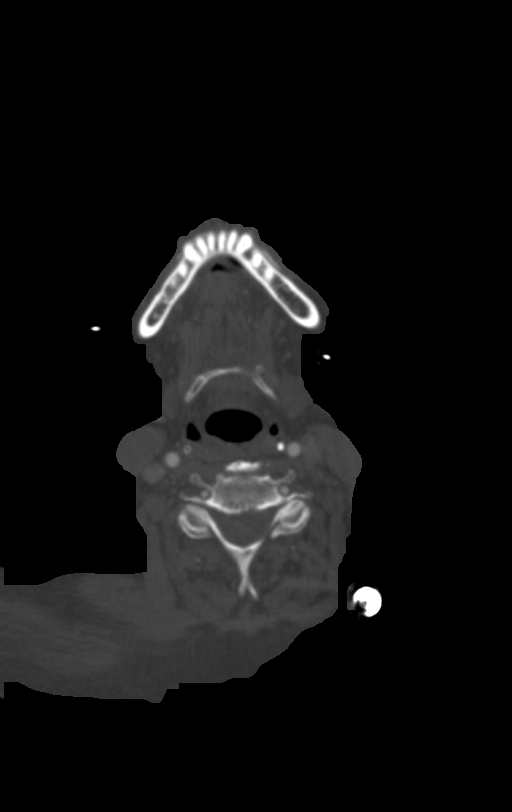
[im 160/319  soft-tissue]
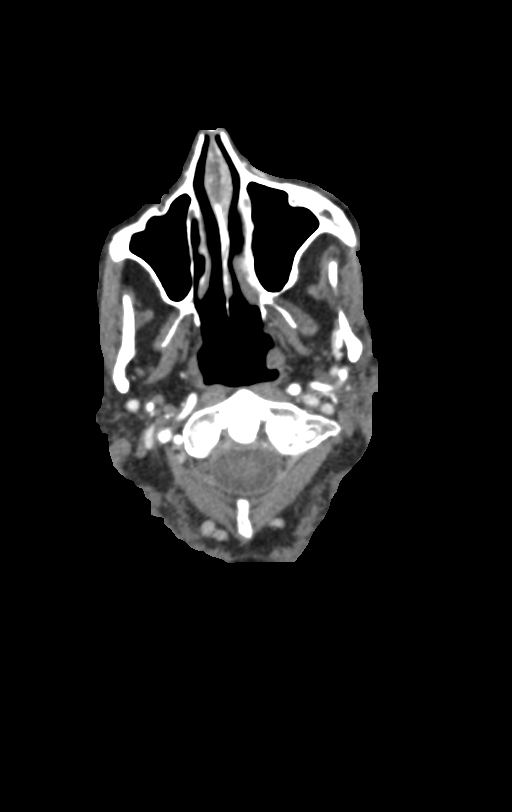
[im 213/319  bone]
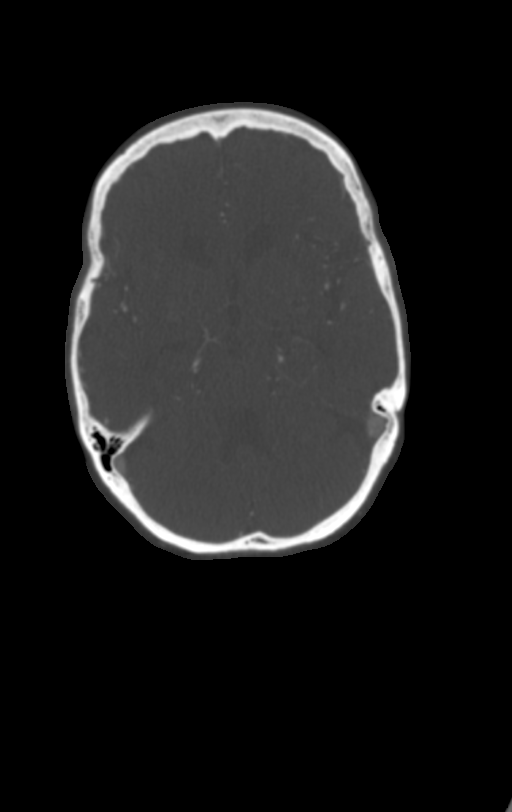
[im 266/319  soft-tissue]
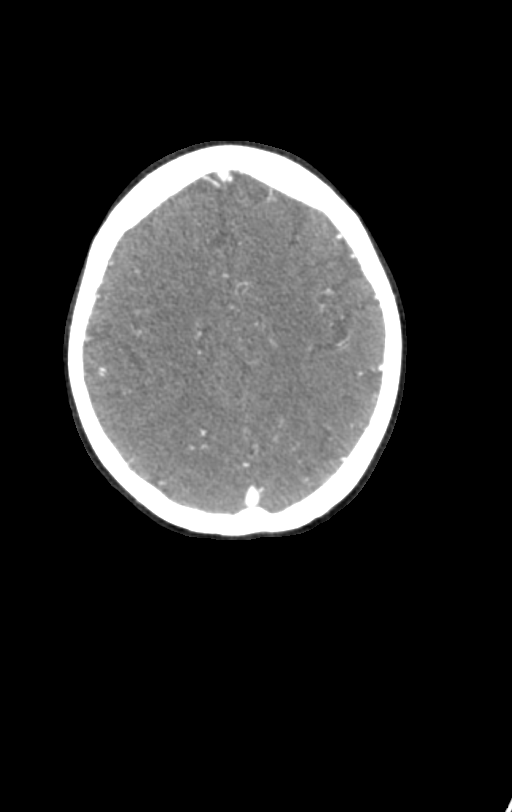

[5 of 33 positions shown; findings below may reference images not displayed]

FINDINGS: CT HEAD FINDINGS

Brain: Multiple small areas of acute infarct bilaterally are best
seen on diffusion-weighted imaging and difficult to see by CT.

Generalized atrophy.  No acute hemorrhage or mass.

Vascular: Negative for hyperdense vessel

Skull: Negative

Sinuses: Negative

Orbits: Negative

Review of the MIP images confirms the above findings

CTA NECK FINDINGS

Aortic arch: Standard branching. Imaged portion shows no evidence of
aneurysm or dissection. No significant stenosis of the major arch
vessel origins. Atherosclerotic calcification in the aortic arch.

Right carotid system: Mild atherosclerotic calcification right
carotid bulb. Just above this area is a thin soft tissue web causing
severe stenosis of the internal carotid artery. On sagittal image
65, there appears to be a very small patent lumen through the web.
Estimated 90% diameter stenosis.

Left carotid system: Atherosclerotic calcification left carotid bulb
without significant stenosis.

Vertebral arteries: Both vertebral arteries are widely patent to the
skull base without stenosis.

Skeleton: No acute skeletal abnormality.

Other neck: Negative for mass or adenopathy. No thyroid tissue.
Correlate with surgical history.

Upper chest: Apical emphysema. 11 mm spiculated nodule right apex
extending to the pleural surface. No change from prior chest CT
09/26/2020

Review of the MIP images confirms the above findings

CTA HEAD FINDINGS

Anterior circulation: Atherosclerotic calcification in the cavernous
carotid bilaterally causing mild stenosis bilaterally. Anterior and
middle cerebral arteries patent bilaterally without stenosis.

Posterior circulation: Both vertebral arteries patent to the
basilar. PICA patent bilaterally. Basilar widely patent. Superior
cerebellar and posterior cerebral arteries patent bilaterally. Fetal
origin posterior cerebral artery bilaterally.

Venous sinuses: Normal venous enhancement.

Anatomic variants: None

Review of the MIP images confirms the above findings
IMPRESSION: 1. Multiple small areas of acute infarct identified on
diffusion-weighted imaging yesterday. These are not identified by
CT. No acute hemorrhage.
2. High-grade stenosis proximal right internal carotid artery due to
a short segment web. Just below the web there is atherosclerotic
calcification not causing significant stenosis
3. No significant left carotid stenosis. Both vertebral arteries
widely patent
4. Negative for intracranial stenosis or large vessel occlusion.
